# Patient Record
Sex: Female | Born: 1935 | Race: White | Hispanic: No | Smoking: Never smoker
Health system: Southern US, Community
[De-identification: ages and names within clinical notes are randomized; demographics above are authoritative.]

## PROBLEM LIST (undated history)

## (undated) DIAGNOSIS — R011 Cardiac murmur, unspecified: Secondary | ICD-10-CM

## (undated) DIAGNOSIS — I350 Nonrheumatic aortic (valve) stenosis: Secondary | ICD-10-CM

## (undated) HISTORY — DX: Nonrheumatic aortic (valve) stenosis: I35.0

## (undated) HISTORY — DX: Cardiac murmur, unspecified: R01.1

---

## 2002-03-18 ENCOUNTER — Other Ambulatory Visit: Admission: RE | Admit: 2002-03-18 | Discharge: 2002-03-18 | Payer: Self-pay | Admitting: Family Medicine

## 2002-07-02 ENCOUNTER — Ambulatory Visit (HOSPITAL_COMMUNITY): Admission: RE | Admit: 2002-07-02 | Discharge: 2002-07-02 | Payer: Self-pay | Admitting: Gastroenterology

## 2004-08-09 ENCOUNTER — Other Ambulatory Visit: Admission: RE | Admit: 2004-08-09 | Discharge: 2004-08-09 | Payer: Self-pay | Admitting: Family Medicine

## 2005-08-31 ENCOUNTER — Other Ambulatory Visit: Admission: RE | Admit: 2005-08-31 | Discharge: 2005-08-31 | Payer: Self-pay | Admitting: Family Medicine

## 2010-03-07 ENCOUNTER — Encounter: Payer: Self-pay | Admitting: Family Medicine

## 2010-06-15 ENCOUNTER — Emergency Department (HOSPITAL_COMMUNITY)
Admission: EM | Admit: 2010-06-15 | Discharge: 2010-06-15 | Disposition: A | Discharge status: Home / Self Care | Payer: Medicare Other | Attending: Emergency Medicine | Admitting: Emergency Medicine

## 2010-06-15 DIAGNOSIS — D500 Iron deficiency anemia secondary to blood loss (chronic): Secondary | ICD-10-CM | POA: Insufficient documentation

## 2010-06-15 DIAGNOSIS — R040 Epistaxis: Secondary | ICD-10-CM | POA: Insufficient documentation

## 2010-06-15 LAB — CBC
MCH: 27.6 pg (ref 26.0–34.0)
MCHC: 31.8 g/dL (ref 30.0–36.0)
Platelets: 162 10*3/uL (ref 150–400)
RBC: 3.55 MIL/uL — ABNORMAL LOW (ref 3.87–5.11)
WBC: 8 10*3/uL (ref 4.0–10.5)

## 2013-04-29 ENCOUNTER — Other Ambulatory Visit: Payer: Self-pay | Admitting: Nurse Practitioner

## 2013-04-29 DIAGNOSIS — Z1231 Encounter for screening mammogram for malignant neoplasm of breast: Secondary | ICD-10-CM

## 2016-04-06 ENCOUNTER — Ambulatory Visit (INDEPENDENT_AMBULATORY_CARE_PROVIDER_SITE_OTHER): Payer: Self-pay

## 2016-04-06 ENCOUNTER — Encounter (INDEPENDENT_AMBULATORY_CARE_PROVIDER_SITE_OTHER): Payer: Self-pay | Admitting: Orthopedic Surgery

## 2016-04-06 ENCOUNTER — Ambulatory Visit (INDEPENDENT_AMBULATORY_CARE_PROVIDER_SITE_OTHER): Payer: Medicare HMO | Admitting: Orthopedic Surgery

## 2016-04-06 ENCOUNTER — Ambulatory Visit (INDEPENDENT_AMBULATORY_CARE_PROVIDER_SITE_OTHER): Payer: Medicare HMO

## 2016-04-06 VITALS — Ht 59.0 in | Wt 95.0 lb

## 2016-04-06 DIAGNOSIS — M79672 Pain in left foot: Secondary | ICD-10-CM

## 2016-04-06 DIAGNOSIS — M79671 Pain in right foot: Secondary | ICD-10-CM | POA: Diagnosis not present

## 2016-04-06 DIAGNOSIS — G8929 Other chronic pain: Secondary | ICD-10-CM

## 2016-04-06 NOTE — Progress Notes (Signed)
   Office Visit Note   Patient: Jean Gibson           Date of Birth: [birth year]           MRN: [id number] Visit Date: 04/06/2016              Requested by: No referring provider defined for this encounter. PCP: No primary care provider on file.   Assessment & Plan: Visit Diagnoses:  1. Pain in right foot   2. Heel pain, chronic, left     Plan: Continue with regular shoewear no restrictions for the proximal phalanx fracture at the PIP joint fourth toe right foot. Patient's calcaneal fracture symptoms have resolved she may continue his regular activities without restrictions.  Follow-Up Instructions: Return if symptoms worsen or fail to improve.   Orders:  Orders Placed This Encounter  Procedures  . XR Foot Complete Right  . XR Foot 2 Views Left   No orders of the defined types were placed in this encounter.     Procedures: No procedures performed   Clinical Data: No additional findings.   Subjective: Chief Complaint  Patient presents with  . Left Ankle - Pain    Hx of left calcaneus fx 2017    Patient states that she was at the beach last week and could hardly walk. She had swelling in the ankle and pain in the heel on the left. She also thinks that she broke her right 4th toe. She is having swelling and some pain in it. She states that she thinks she broke it while walking on rocks and shells.     Review of Systems   Objective: Vital Signs: Ht 4\' 11"  (1.499 m)   Wt 95 lb (43.1 kg)   BMI 19.19 kg/m   Physical Exam examination patient is alert oriented no adenopathy well-dressed normal affect normal respiratory effort she has a normal gait. She has good pulses bilaterally good range of motion the ankle and subtalar joint bilaterally she has swelling of the fourth toe but no angular deformity right foot. Calcaneus is nontender with lateral compression.  Ortho Exam  Specialty Comments:  No specialty comments available.  Imaging: Xr Foot 2 Views  Left  Result Date: 04/06/2016 Two-view radiographs of the left calcaneus shows a congruent subtalar joint and calcaneal fractures well healed no complicating features.  Xr Foot Complete Right  Result Date: 04/06/2016 Three-view radiographs the right foot shows a fracture of the proximal phalanx of the PIP joint fourth toe right foot. No evidence of any stress fractures.    PMFS History: There are no active problems to display for this patient.  No past medical history on file.  No family history on file.  No past surgical history on file. Social History   Occupational History  . Not on file.   Social History Main Topics  . Smoking status: Never Smoker  . Smokeless tobacco: Never Used  . Alcohol use Not on file  . Drug use: Unknown  . Sexual activity: Not on file

## 2021-03-09 ENCOUNTER — Ambulatory Visit (INDEPENDENT_AMBULATORY_CARE_PROVIDER_SITE_OTHER): Payer: Medicare Other | Admitting: Family

## 2021-03-09 ENCOUNTER — Ambulatory Visit (INDEPENDENT_AMBULATORY_CARE_PROVIDER_SITE_OTHER): Payer: Medicare Other

## 2021-03-09 ENCOUNTER — Telehealth: Payer: Self-pay | Admitting: Family

## 2021-03-09 ENCOUNTER — Other Ambulatory Visit: Payer: Self-pay

## 2021-03-09 DIAGNOSIS — M79671 Pain in right foot: Secondary | ICD-10-CM | POA: Diagnosis not present

## 2021-03-09 NOTE — Telephone Encounter (Signed)
Patient's daughter Aurea Graff called advised patient would like to have the hard shoe  for her right foot and asked if she can come get it? The number to contact Aurea Graff is [phone] or the number to [facility] is [phone]

## 2021-03-10 NOTE — Telephone Encounter (Signed)
Ok per [employer] for post op shoe. Can you please call and advise ok to pick up and sign I pad? Thanks!

## 2021-03-10 NOTE — Progress Notes (Signed)
° °  Office Visit Note   Patient: Jean Gibson           Date of Birth: Jan 16, 1928           MRN: [id number] Visit Date: 03/09/2021              Requested by: No referring provider defined for this encounter. PCP: No primary care provider on file.  Chief Complaint  Patient presents with   Right Foot - Pain    DOI 03/08/2021 tripped and fell on right foot last night       HPI: Patient is a 88 year old woman who was ambulating in her home when she tripped and fell last night.  She has been having pain over the dorsum of her foot and along the lateral column since this is associated with some mild swelling and bruising.  Today she is walking in some stiff boots which are providing comfort  Assessment & Plan: Visit Diagnoses:  1. Pain in right foot     Plan: We will treat her for right foot fracture.  Offered a postop shoe at this time the patient declined she is fears that the shoe will make her at risk for falling discussed using stiff walking shoes she will follow-up in the office in 2 weeks she may weight-bear as tolerated in a stiff shoe  Patient and family are in agreement with the plan  Follow-Up Instructions: No follow-ups on file.   Ortho Exam  Patient is alert, oriented, no adenopathy, well-dressed, normal affect, normal respiratory effort.  On examination of the right lower extremity she has minimal edema mild ecchymosis over the dorsum and lateral column she is tender to palpation along the base of the fifth metatarsal. Palpable dorsalis pedis pulse.  She is neurovascularly intact distally.  Imaging: No results found. No images are attached to the encounter.  Labs: No results found for: HGBA1C, ESRSEDRATE, CRP, LABURIC, REPTSTATUS, GRAMSTAIN, CULT, LABORGA   No results found for: ALBUMIN, PREALBUMIN, CBC  No results found for: MG No results found for: VD25OH  No results found for: PREALBUMIN CBC EXTENDED Latest Ref Rng & Units 06/15/2010  WBC 4.0 - 10.5 K/uL  8.0  RBC 3.87 - 5.11 MIL/uL 3.55(L)  HGB 12.0 - 15.0 g/dL 9.8(L)  HCT 36.0 - 46.0 % 30.8(L)  PLT 150 - 400 K/uL 162     There is no height or weight on file to calculate BMI.  Orders:  Orders Placed This Encounter  Procedures   XR Foot 2 Views Right   No orders of the defined types were placed in this encounter.    Procedures: No procedures performed  Clinical Data: No additional findings.  ROS:  All other systems negative, except as noted in the HPI. Review of Systems  Constitutional: Negative.    Objective: Vital Signs: There were no vitals taken for this visit.  Specialty Comments:  No specialty comments available.  PMFS History: There are no problems to display for this patient.  History reviewed. No pertinent past medical history.  History reviewed. No pertinent family history.  History reviewed. No pertinent surgical history. Social History   Occupational History   Not on file  Tobacco Use   Smoking status: Never   Smokeless tobacco: Never  Substance and Sexual Activity   Alcohol use: Not on file   Drug use: Not on file   Sexual activity: Not on file

## 2021-03-10 NOTE — Telephone Encounter (Signed)
I spoke with pt's dtr, informed her okay for post op shoe and she said she already bought a shoe for her mom last night.

## 2021-03-19 ENCOUNTER — Encounter: Payer: Self-pay | Admitting: Family

## 2021-04-01 ENCOUNTER — Ambulatory Visit: Payer: Self-pay

## 2021-04-01 ENCOUNTER — Other Ambulatory Visit: Payer: Self-pay

## 2021-04-01 ENCOUNTER — Ambulatory Visit: Payer: Medicare Other | Admitting: Orthopedic Surgery

## 2021-04-01 DIAGNOSIS — S99191D Other physeal fracture of right metatarsal, subsequent encounter for fracture with routine healing: Secondary | ICD-10-CM

## 2021-04-01 DIAGNOSIS — M79671 Pain in right foot: Secondary | ICD-10-CM

## 2021-04-13 ENCOUNTER — Encounter: Payer: Self-pay | Admitting: Orthopedic Surgery

## 2021-04-13 NOTE — Progress Notes (Signed)
° °  Office Visit Note   Patient: Jean Gibson           Date of Birth: October 29, 1927           MRN: [id number] Visit Date: 04/01/2021              Requested by: No referring provider defined for this encounter. PCP: No primary care provider on file.  Chief Complaint  Patient presents with   Right Foot - Follow-up    DOI 03/08/21      HPI: Patient is a 88 year old woman who is seen in follow-up for nondisplaced fracture through the metaphyseal bone base of the fifth metatarsal right foot.  Patient states she feels well has a postoperative shoe is not wearing a fracture boot at this time.  Assessment & Plan: Visit Diagnoses:  1. Pain in right foot   2. Closed fracture of base of fifth metatarsal bone of right foot at metaphyseal-diaphyseal junction with routine healing, subsequent encounter     Plan: Patient's fracture is healed she will advance to regular shoewear.  Follow-Up Instructions: Return if symptoms worsen or fail to improve.   Ortho Exam  Patient is alert, oriented, no adenopathy, well-dressed, normal affect, normal respiratory effort. Examination the fifth metatarsal is nontender to palpation there is no ecchymosis or bruising.  Distraction across the hindfoot is not painful.  Imaging: No results found. No images are attached to the encounter.  Labs: No results found for: HGBA1C, ESRSEDRATE, CRP, LABURIC, REPTSTATUS, GRAMSTAIN, CULT, LABORGA   No results found for: ALBUMIN, PREALBUMIN, CBC  No results found for: MG No results found for: VD25OH  No results found for: PREALBUMIN CBC EXTENDED Latest Ref Rng & Units 06/15/2010  WBC 4.0 - 10.5 K/uL 8.0  RBC 3.87 - 5.11 MIL/uL 3.55(L)  HGB 12.0 - 15.0 g/dL 1.6(L)  HCT 84.5 - 36.4 % 30.8(L)  PLT 150 - 400 K/uL 162     There is no height or weight on file to calculate BMI.  Orders:  Orders Placed This Encounter  Procedures   XR Foot Complete Right   No orders of the defined types were placed in this  encounter.    Procedures: No procedures performed  Clinical Data: No additional findings.  ROS:  All other systems negative, except as noted in the HPI. Review of Systems  Objective: Vital Signs: There were no vitals taken for this visit.  Specialty Comments:  No specialty comments available.  PMFS History: There are no problems to display for this patient.  History reviewed. No pertinent past medical history.  History reviewed. No pertinent family history.  History reviewed. No pertinent surgical history. Social History   Occupational History   Not on file  Tobacco Use   Smoking status: Never   Smokeless tobacco: Never  Substance and Sexual Activity   Alcohol use: Not on file   Drug use: Not on file   Sexual activity: Not on file

## 2021-06-15 ENCOUNTER — Emergency Department (HOSPITAL_COMMUNITY)
Admission: EM | Admit: 2021-06-15 | Discharge: 2021-06-15 | Disposition: A | Discharge status: Home / Self Care | Payer: Medicare Other | Attending: Emergency Medicine | Admitting: Emergency Medicine

## 2021-06-15 ENCOUNTER — Emergency Department (HOSPITAL_COMMUNITY): Payer: Medicare Other

## 2021-06-15 ENCOUNTER — Other Ambulatory Visit: Payer: Self-pay

## 2021-06-15 ENCOUNTER — Encounter (HOSPITAL_COMMUNITY): Payer: Self-pay

## 2021-06-15 DIAGNOSIS — R410 Disorientation, unspecified: Secondary | ICD-10-CM

## 2021-06-15 DIAGNOSIS — Z7982 Long term (current) use of aspirin: Secondary | ICD-10-CM | POA: Diagnosis not present

## 2021-06-15 LAB — CBC
HCT: 36.8 % (ref 36.0–46.0)
Hemoglobin: 11.8 g/dL — ABNORMAL LOW (ref 12.0–15.0)
MCH: 29.8 pg (ref 26.0–34.0)
MCHC: 32.1 g/dL (ref 30.0–36.0)
MCV: 92.9 fL (ref 80.0–100.0)
Platelets: 188 10*3/uL (ref 150–400)
RBC: 3.96 MIL/uL (ref 3.87–5.11)
RDW: 13.2 % (ref 11.5–15.5)
WBC: 10.1 10*3/uL (ref 4.0–10.5)
nRBC: 0 % (ref 0.0–0.2)

## 2021-06-15 LAB — COMPREHENSIVE METABOLIC PANEL
ALT: 17 U/L (ref 0–44)
AST: 25 U/L (ref 15–41)
Albumin: 3.9 g/dL (ref 3.5–5.0)
Alkaline Phosphatase: 62 U/L (ref 38–126)
Anion gap: 8 (ref 5–15)
BUN: 24 mg/dL — ABNORMAL HIGH (ref 8–23)
CO2: 25 mmol/L (ref 22–32)
Calcium: 9 mg/dL (ref 8.9–10.3)
Chloride: 102 mmol/L (ref 98–111)
Creatinine, Ser: 0.7 mg/dL (ref 0.44–1.00)
GFR, Estimated: >60 mL/min (ref >60)
Glucose, Bld: 129 mg/dL — ABNORMAL HIGH (ref 70–99)
Potassium: 4.1 mmol/L (ref 3.5–5.1)
Sodium: 135 mmol/L (ref 135–145)
Total Bilirubin: 0.5 mg/dL (ref 0.3–1.2)
Total Protein: 7.2 g/dL (ref 6.5–8.1)

## 2021-06-15 LAB — URINALYSIS, ROUTINE W REFLEX MICROSCOPIC
Bilirubin Urine: NEGATIVE
Glucose, UA: NEGATIVE mg/dL
Hgb urine dipstick: NEGATIVE
Ketones, ur: NEGATIVE mg/dL
Leukocytes,Ua: NEGATIVE
Nitrite: NEGATIVE
Protein, ur: NEGATIVE mg/dL
Specific Gravity, Urine: 1.009 (ref 1.005–1.030)
pH: 7 (ref 5.0–8.0)

## 2021-06-15 NOTE — Discharge Instructions (Signed)
Your CT scan of the brain and your blood work and urine sample thankfully did not show any signs of any serious injury or emergency.  I recommend you follow-up with your primary care doctor. ?
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
## 2021-06-15 NOTE — ED Notes (Signed)
I provided reinforced discharge education based off of discharge instructions. Pt acknowledged and understood my education. Pt had no further questions/concerns for provider/myself.  °
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
## 2023-04-16 IMAGING — CT CT HEAD W/O CM
3 series · 14 of 46 positions shown, 16 images · non-contrast
Comparison: None Available.

CLINICAL DATA: Confusion



[Series 2: head wo · axial · 0.47mm/px · z∈[-126,-6]mm · 8 of 29 slices shown, 10 images]
[im 3/29  brain]
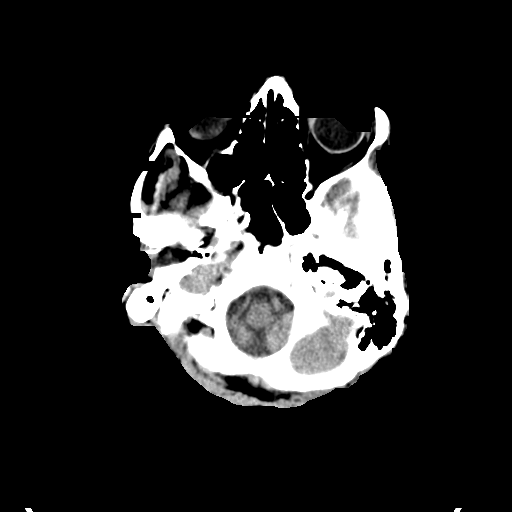
[im 3/29  bone]
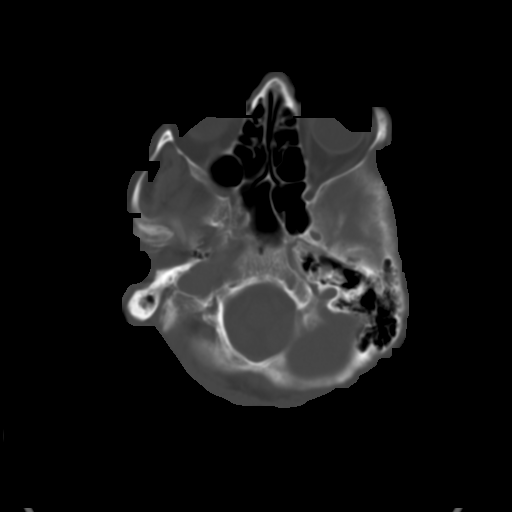
[im 7/29  brain]
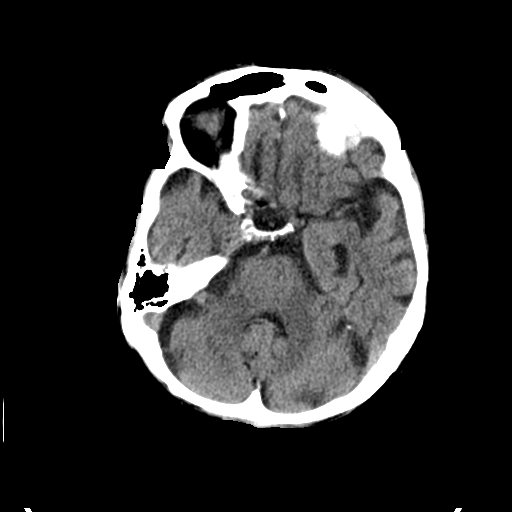
[im 10/29  brain]
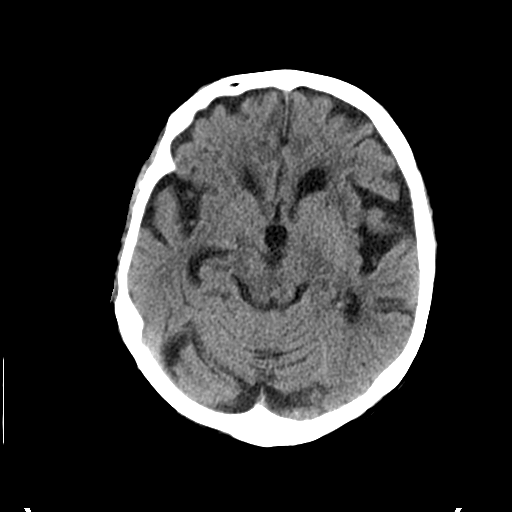
[im 13/29  brain]
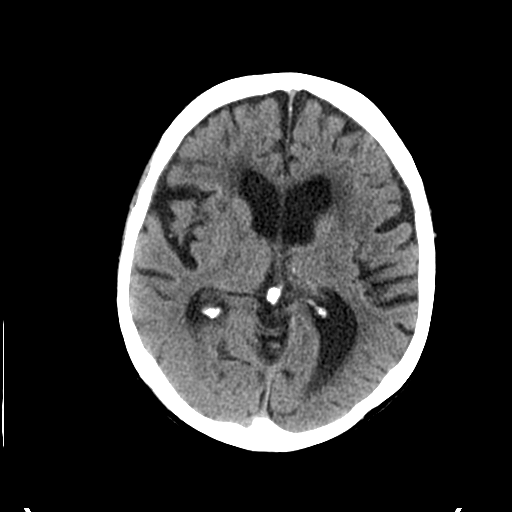
[im 17/29  brain]
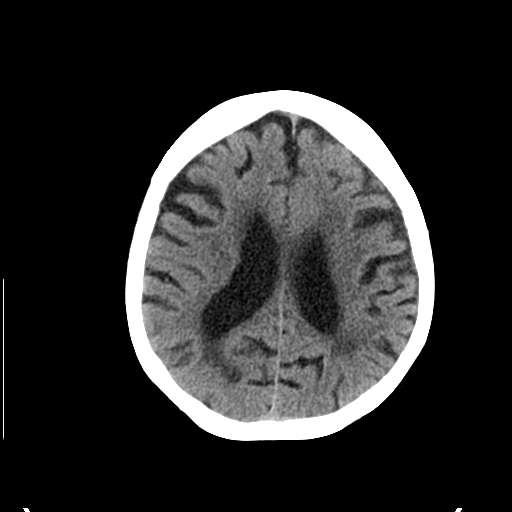
[im 17/29  bone]
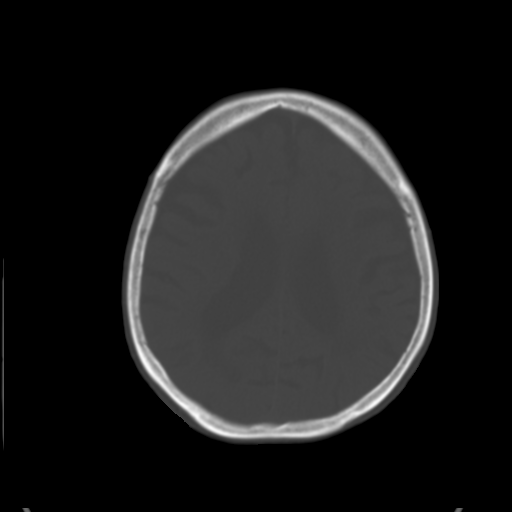
[im 20/29  brain]
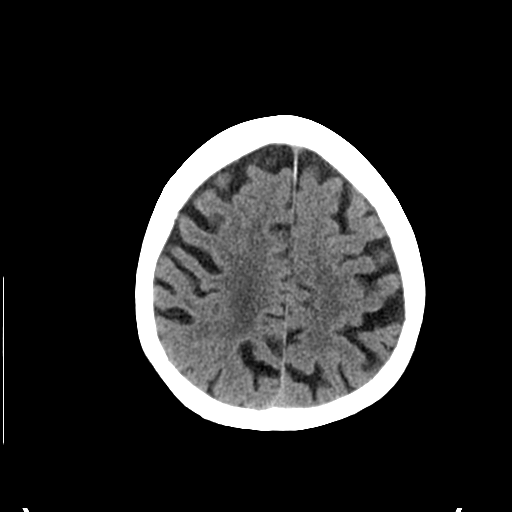
[im 23/29  brain]
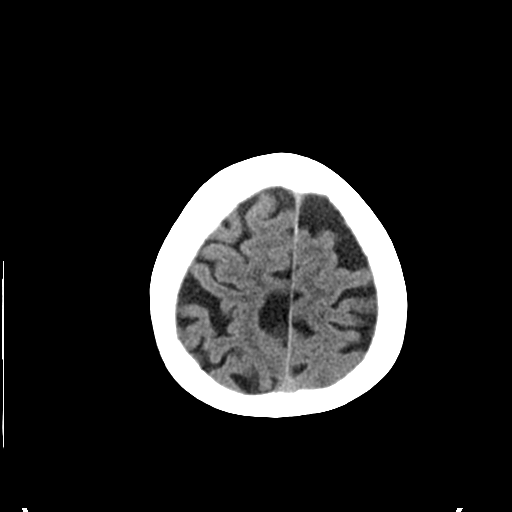
[im 27/29  brain]
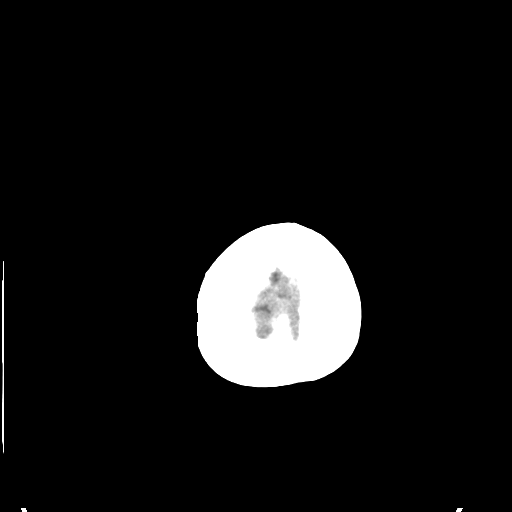

[Series 4: coronal soft tissue · coronal · 0.30mm/px · 3 of 61 slices shown]
[im 21/61  brain]
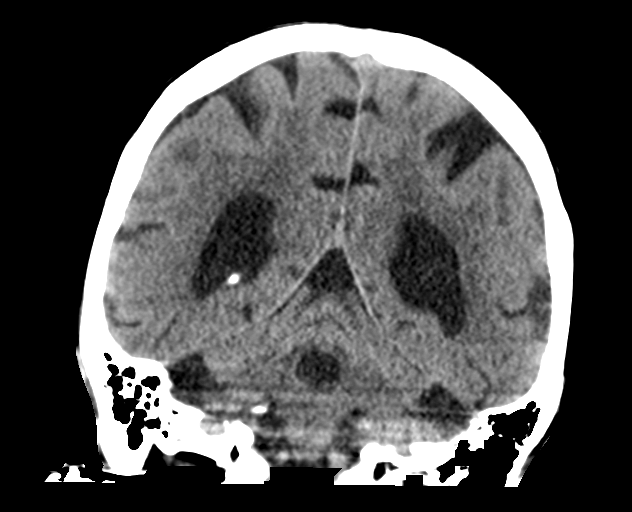
[im 27/61  brain]
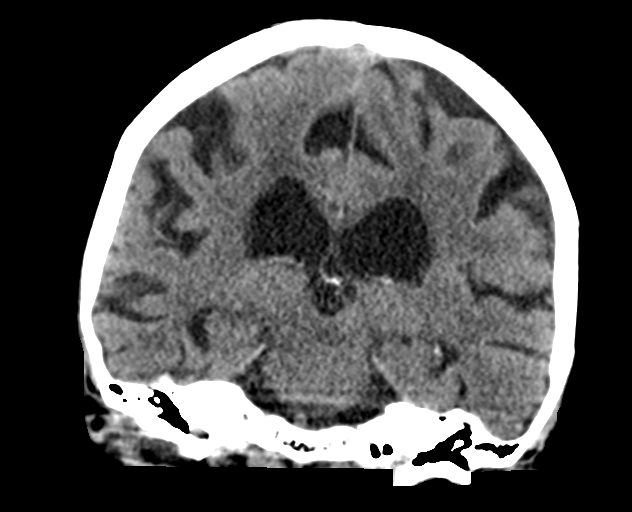
[im 34/61  brain]
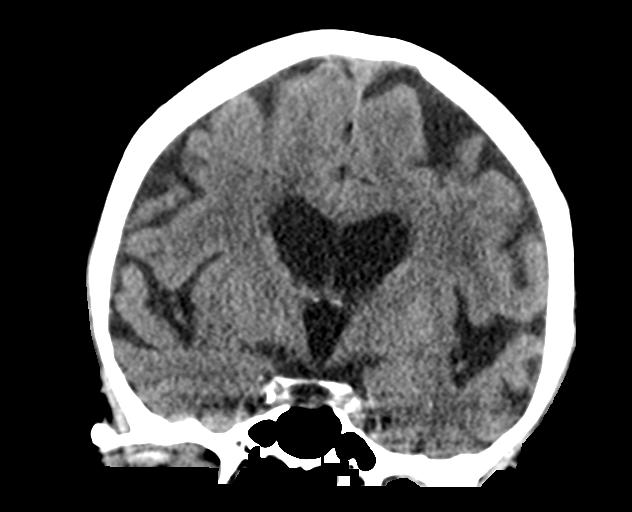

[Series 5: sagittal soft tissue · sagittal · 0.32mm/px · 3 of 52 slices shown]
[im 18/52  brain]
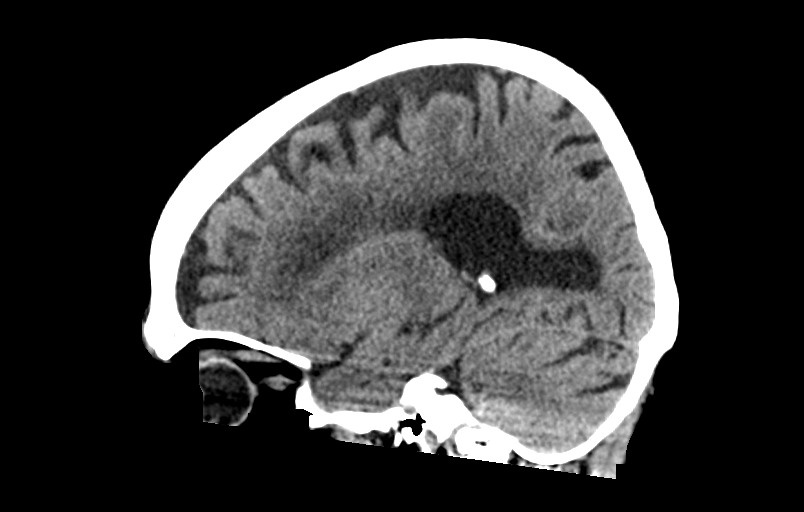
[im 26/52  brain]
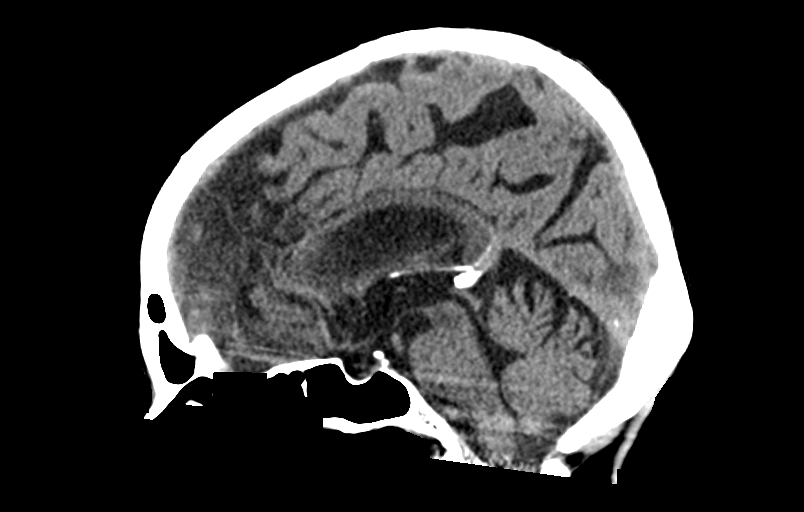
[im 35/52  brain]
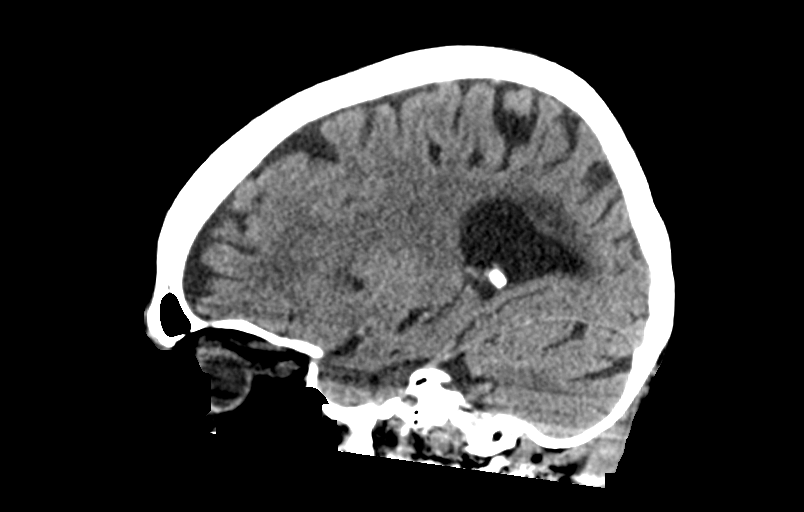

[14 of 46 positions shown; findings below may reference images not displayed]

FINDINGS: Brain: No acute territorial infarction, hemorrhage or intracranial
mass. Atrophy and chronic small vessel ischemic changes of the white
matter. Chronic lacunar infarct in the right basal ganglia.
Ventricles are nonenlarged

Vascular: No hyperdense vessels.  Carotid vascular calcification

Skull: Normal. Negative for fracture or focal lesion.

Sinuses/Orbits: No acute finding.

Other: None
IMPRESSION: 1. No CT evidence for acute intracranial abnormality.
2. Atrophy and chronic small vessel ischemic changes of the white
matter

## 2023-12-01 ENCOUNTER — Encounter (HOSPITAL_BASED_OUTPATIENT_CLINIC_OR_DEPARTMENT_OTHER): Payer: Self-pay

## 2023-12-01 ENCOUNTER — Emergency Department (HOSPITAL_BASED_OUTPATIENT_CLINIC_OR_DEPARTMENT_OTHER)
Admission: EM | Admit: 2023-12-01 | Discharge: 2023-12-01 | Disposition: A | Discharge status: Home / Self Care | Attending: Emergency Medicine | Admitting: Emergency Medicine

## 2023-12-01 ENCOUNTER — Other Ambulatory Visit: Payer: Self-pay

## 2023-12-01 ENCOUNTER — Emergency Department (HOSPITAL_BASED_OUTPATIENT_CLINIC_OR_DEPARTMENT_OTHER)

## 2023-12-01 DIAGNOSIS — S01312A Laceration without foreign body of left ear, initial encounter: Secondary | ICD-10-CM | POA: Insufficient documentation

## 2023-12-01 DIAGNOSIS — S0101XA Laceration without foreign body of scalp, initial encounter: Secondary | ICD-10-CM | POA: Insufficient documentation

## 2023-12-01 DIAGNOSIS — W01198A Fall on same level from slipping, tripping and stumbling with subsequent striking against other object, initial encounter: Secondary | ICD-10-CM | POA: Insufficient documentation

## 2023-12-01 DIAGNOSIS — Z7982 Long term (current) use of aspirin: Secondary | ICD-10-CM | POA: Insufficient documentation

## 2023-12-01 DIAGNOSIS — R42 Dizziness and giddiness: Secondary | ICD-10-CM | POA: Insufficient documentation

## 2023-12-01 DIAGNOSIS — S0990XA Unspecified injury of head, initial encounter: Secondary | ICD-10-CM | POA: Diagnosis present

## 2023-12-01 DIAGNOSIS — W19XXXA Unspecified fall, initial encounter: Secondary | ICD-10-CM

## 2023-12-01 LAB — COMPREHENSIVE METABOLIC PANEL WITH GFR
ALT: 53 U/L — ABNORMAL HIGH (ref 0–44)
AST: 36 U/L (ref 15–41)
Albumin: 4.2 g/dL (ref 3.5–5.0)
Alkaline Phosphatase: 132 U/L — ABNORMAL HIGH (ref 38–126)
Anion gap: 10 (ref 5–15)
BUN: 30 mg/dL — ABNORMAL HIGH (ref 8–23)
CO2: 27 mmol/L (ref 22–32)
Calcium: 9.5 mg/dL (ref 8.9–10.3)
Chloride: 102 mmol/L (ref 98–111)
Creatinine, Ser: 0.64 mg/dL (ref 0.44–1.00)
GFR, Estimated: >60 mL/min (ref >60)
Glucose, Bld: 149 mg/dL — ABNORMAL HIGH (ref 70–99)
Potassium: 4 mmol/L (ref 3.5–5.1)
Sodium: 139 mmol/L (ref 135–145)
Total Bilirubin: 0.4 mg/dL (ref 0.0–1.2)
Total Protein: 7.6 g/dL (ref 6.5–8.1)

## 2023-12-01 LAB — CBC
HCT: 34.2 % — ABNORMAL LOW (ref 36.0–46.0)
Hemoglobin: 10.6 g/dL — ABNORMAL LOW (ref 12.0–15.0)
MCH: 28.7 pg (ref 26.0–34.0)
MCHC: 31 g/dL (ref 30.0–36.0)
MCV: 92.7 fL (ref 80.0–100.0)
Platelets: 216 K/uL (ref 150–400)
RBC: 3.69 MIL/uL — ABNORMAL LOW (ref 3.87–5.11)
RDW: 15.6 % — ABNORMAL HIGH (ref 11.5–15.5)
WBC: 6.1 K/uL (ref 4.0–10.5)
nRBC: 0 % (ref 0.0–0.2)

## 2023-12-01 LAB — URINALYSIS, ROUTINE W REFLEX MICROSCOPIC
Bacteria, UA: NONE SEEN
Bilirubin Urine: NEGATIVE
Glucose, UA: NEGATIVE mg/dL
Ketones, ur: NEGATIVE mg/dL
Leukocytes,Ua: NEGATIVE
Nitrite: NEGATIVE
Specific Gravity, Urine: 1.026 (ref 1.005–1.030)
pH: 7 (ref 5.0–8.0)

## 2023-12-01 LAB — CBG MONITORING, ED: Glucose-Capillary: 142 mg/dL — ABNORMAL HIGH (ref 70–99)

## 2023-12-01 MED ORDER — TETANUS-DIPHTH-ACELL PERTUSSIS 5-2-15.5 LF-MCG/0.5 IM SUSP
0.5000 mL | Freq: Once | INTRAMUSCULAR | Status: AC
  Administered 2023-12-01: 0.5 mL via INTRAMUSCULAR
  Filled 2023-12-01: qty 0.5

## 2023-12-01 MED ADMIN — Bacitracin Zinc Oint 500 Unit/GM: 31.5 | TOPICAL | NDC 58980001110

## 2023-12-01 MED FILL — Bacitracin Zinc Oint 500 Unit/GM: CUTANEOUS | Qty: 28.35 | Status: AC

## 2023-12-01 NOTE — ED Provider Notes (Signed)
 [place] EMERGENCY DEPARTMENT AT [facility] Provider Note   CSN: [id number] Arrival date & time: 12/01/23  9165     Patient presents with: Loss of Consciousness and Head Injury   Jean Gibson is a 89 y.o. female.  {Add pertinent medical, surgical, social history, OB history to HPI:32947}  Loss of Consciousness Head Injury     History reviewed. No pertinent past medical history.  Prior to Admission medications   Medication Sig Start Date End Date Taking? Authorizing Provider  aspirin 81 MG chewable tablet Chew by mouth daily.    [provider]  Calcium Carbonate-Vitamin D 600-400 MG-UNIT tablet Take by mouth. 04/24/13   [provider]  Multiple Vitamins-Minerals (CENTRUM SILVER PO) Take by mouth.    [provider]    Allergies: Patient has no known allergies.    Review of Systems  Cardiovascular:  Positive for syncope.    Updated Vital Signs BP (!) 152/66   Pulse 86   Temp 98.7 F (37.1 C) (Oral)   Resp (!) 29   SpO2 95%   Physical Exam  (all labs ordered are listed, but only abnormal results are displayed) Labs Reviewed  COMPREHENSIVE METABOLIC PANEL WITH GFR - Abnormal; Notable for the following components:      Result Value   Glucose, Bld 149 (*)    BUN 30 (*)    ALT 53 (*)    Alkaline Phosphatase 132 (*)    All other components within normal limits  CBC - Abnormal; Notable for the following components:   RBC 3.69 (*)    Hemoglobin 10.6 (*)    HCT 34.2 (*)    RDW 15.6 (*)    All other components within normal limits  URINALYSIS, ROUTINE W REFLEX MICROSCOPIC - Abnormal; Notable for the following components:   Hgb urine dipstick TRACE (*)    Protein, ur TRACE (*)    All other components within normal limits  CBG MONITORING, ED - Abnormal; Notable for the following components:   Glucose-Capillary 142 (*)    All other components within normal limits    EKG: EKG Interpretation Date/Time:  Friday December 01 2023 08:49:23 EDT Ventricular Rate:  96 PR Interval:  206 QRS Duration:  92 QT Interval:  378 QTC Calculation: 477 R Axis:   -33  Text Interpretation: Normal sinus rhythm Left axis deviation Moderate voltage criteria for LVH, may be normal variant ( R in aVL , Cornell product ) Anterior infarct , age undetermined Abnormal ECG No previous ECGs available Confirmed by Patsey Lot [id number]) on 12/01/2023 9:30:17 AM  Radiology: CT Cervical Spine Wo Contrast Result Date: 12/01/2023 EXAM: CT CERVICAL SPINE WITHOUT CONTRAST 12/01/2023 09:23:39 AM TECHNIQUE: CT of the cervical spine was performed without the administration of intravenous contrast. Multiplanar reformatted images are provided for review. Automated exposure control, iterative reconstruction, and/or weight based adjustment of the mA/kV was utilized to reduce the radiation dose to as low as reasonably achievable. COMPARISON: Head CT 12/01/2023 reported separately. CLINICAL HISTORY: 89 year old female. Blunt facial trauma. FINDINGS: CERVICAL SPINE: BONES AND ALIGNMENT: Mild straightening of lordosis. C1 and C2 appear intact and aligned. Maintained vertebral height. No acute fracture or traumatic malalignment. DEGENERATIVE CHANGES: Chronic severe C1-C2 degeneration craniocervical junction. Advanced chronic disc and endplate degeneration throughout the cervical and visible upper thoracic spine. No significant cervical spinal stenosis by CT. SOFT TISSUES: No prevertebral soft tissue swelling. Calcified cervical carotid atherosclerosis. LUNGS (visible portions): Symmetric apical lung scarring. IMPRESSION: 1. No acute  traumatic injury identified in the cervical spine. 2. Chronic degeneration. Electronically signed by: Helayne Hurst MD 12/01/2023 09:43 AM EDT RP Workstation: [id number]   CT Head Wo Contrast Result Date: 12/01/2023 EXAM: CT HEAD WITHOUT CONTRAST 12/01/2023 09:23:39 AM TECHNIQUE: CT of the head was performed without the administration  of intravenous contrast. Automated exposure control, iterative reconstruction, and/or weight based adjustment of the mA/kV was utilized to reduce the radiation dose to as low as reasonably achievable. COMPARISON: Head CT 06/15/2021. CLINICAL HISTORY: 89 year old female. Head trauma, GCS=15, no focal neuro findings (low risk). FINDINGS: BRAIN AND VENTRICLES: No acute hemorrhage. No evidence of acute infarct. No hydrocephalus. No extra-axial collection. No mass effect or midline shift. Stable brain volume since 2023. 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 No suspicious intracranial vascular hyperdensity. ORBITS: No acute abnormality. SINUSES: No acute abnormality. SOFT TISSUES AND SKULL: Left lateral scalp superficial laceration with overlying dressing material in place. No discrete scalp hematoma. Calvarium appears stable and intact. IMPRESSION: 1. Left lateral scalp soft tissue injury. 2. No acute intracranial abnormality. Stable mild for age chronic small vessel disease. Electronically signed by: Helayne Hurst MD 12/01/2023 09:40 AM EDT RP Workstation: [id number]    {Document cardiac monitor, telemetry assessment procedure when appropriate:32947} Procedures   Medications Ordered in the ED  bacitracin ointment (has no administration in time range)      {Click here for ABCD2, HEART and other calculators REFRESH Note before signing:1}                              Medical Decision Making Amount and/or Complexity of Data Reviewed Labs: ordered. Radiology: ordered.  Risk OTC drugs.   ***  {Document critical care time when appropriate  Document review of labs and clinical decision tools ie CHADS2VASC2, etc  Document your independent review of radiology images and any outside records  Document your discussion with family members, caretakers and with consultants  Document  social determinants of health affecting pt's care  Document your decision making why or why not admission, treatments were needed:32947:::1}   Final diagnoses:  None    ED Discharge Orders     None

## 2023-12-01 NOTE — ED Triage Notes (Addendum)
 Patient reports washing up at the sink, feeling dizzy and falling landing on the trash can, cracking it in half and also reports that there are some dumbbells near the trash can too she may have hit. Patient is not anticoagulated. She reports yesterday she had blood taken and a flu shot from her PCP. Has bleeding from left side of head, controlled with gauze.

## 2023-12-01 NOTE — Discharge Instructions (Signed)
 This staples can come out in a week to 10 days.  Antibacterial ointment can be used on the ear.  Dressings can also be used.  Follow-up with ENT.

## 2023-12-01 NOTE — ED Notes (Signed)
 Health teaching done about dressing of wound /suture.

## 2023-12-12 ENCOUNTER — Ambulatory Visit: Attending: Cardiology | Admitting: Cardiology

## 2023-12-12 ENCOUNTER — Ambulatory Visit: Attending: Cardiology

## 2023-12-12 ENCOUNTER — Encounter: Payer: Self-pay | Admitting: Cardiology

## 2023-12-12 VITALS — BP 134/82 | HR 74 | Ht <= 58 in | Wt 86.8 lb

## 2023-12-12 DIAGNOSIS — R011 Cardiac murmur, unspecified: Secondary | ICD-10-CM | POA: Diagnosis not present

## 2023-12-12 DIAGNOSIS — R55 Syncope and collapse: Secondary | ICD-10-CM

## 2023-12-12 NOTE — Patient Instructions (Addendum)
 Medication Instructions:  Your physician recommends that you continue on your current medications as directed. Please refer to the Current Medication list given to you today.    *If you need a refill on your cardiac medications before your next appointment, please call your pharmacy*   Lab Work: None   If you have labs (blood work) drawn today and your tests are completely normal, you will receive your results only by: MyChart Message (if you have MyChart) OR A paper copy in the mail If you have any lab test that is abnormal or we need to change your treatment, we will call you to review the results.    Testing/Procedures: ASAP Echocardiogram Your physician has requested that you have an echocardiogram. Echocardiography is a painless test that uses sound waves to create images of your heart. It provides your doctor with information about the size and shape of your heart and how well your heart's chambers and valves are working. This procedure takes approximately one hour. There are no restrictions for this procedure. Please do NOT wear cologne, perfume, aftershave, or lotions (deodorant is allowed). Please arrive 15 minutes prior to your appointment time.  Please note: We ask at that you not bring children with you during ultrasound (echo/ vascular) testing. Due to room size and safety concerns, children are not allowed in the ultrasound rooms during exams. Our front office staff cannot provide observation of children in our lobby area while testing is being conducted. An adult accompanying a patient to their appointment will only be allowed in the ultrasound room at the discretion of the ultrasound technician under special circumstances. We apologize for any inconvenience.   Your physician has recommended that you wear an event monitor. Event monitors are medical devices that record the heart's electrical activity. Doctors most often us  these monitors to diagnose arrhythmias. Arrhythmias  are problems with the speed or rhythm of the heartbeat. The monitor is a small, portable device. You can wear one while you do your normal daily activities. This is usually used to diagnose what is causing palpitations/syncope (passing out).  `  ZIO AT Long term monitor-Live Telemetry  Your physician has requested you wear a ZIO patch monitor for 14 days.  This is a single patch monitor. [employer] supplies one patch monitor per enrollment. Additional  stickers are not available.  Please do not apply patch if you will be having a Nuclear Stress Test, Echocardiogram, Cardiac CT, MRI,  or Chest Xray during the period you would be wearing the monitor. The patch cannot be worn during  these tests. You cannot remove and re-apply the ZIO AT patch monitor.  Your ZIO patch monitor will be mailed 3 day USPS to your address on file. It may take 3-5 days to  receive your monitor after you have been enrolled.  Once you have received your monitor, please review the enclosed instructions. Your monitor has  already been registered assigning a specific monitor serial # to you.   Billing and Patient Assistance Program information  Jean Gibson has been supplied with any insurance information on record for billing. [employer] offers a sliding scale Patient Assistance Program for patients without insurance, or whose  insurance does not completely cover the cost of the ZIO patch monitor. You must apply for the  Patient Assistance Program to qualify for the discounted rate. To apply, call [employer] at [phone],  select option 4, select option 2 , ask to apply for the Patient Assistance Program, (you can request an  interpreter  if needed). [employer] will ask your household income and how many people are in your  household. [employer] will quote your out-of-pocket cost based on this information. They will also be able  to set up a 12 month interest free payment plan if needed.  Applying the monitor   Shave hair from upper  left chest.  Hold the abrader disc by orange tab. Rub the abrader in 40 strokes over left upper chest as indicated in  your monitor instructions.  Clean area with 4 enclosed alcohol pads. Use all pads to ensure the area is cleaned thoroughly. Let  dry.  Apply patch as indicated in monitor instructions. Patch will be placed under collarbone on left side of  chest with arrow pointing upward.  Rub patch adhesive wings for 2 minutes. Remove the white label marked 1. Remove the white label  marked 2. Rub patch adhesive wings for 2 additional minutes.  While looking in a mirror, press and release button in center of patch. A small green light will flash 3-4  times. This will be your only indicator that the monitor has been turned on.  Do not shower for the first 24 hours. You may shower after the first 24 hours.  Press the button if you feel a symptom. You will hear a small click. Record Date, Time and Symptom in  the Patient Log.   Starting the Gateway  In your kit there is a audiological scientist box the size of a cellphone. This is [employer]. It transmits all your  recorded data to [facility]. This box must always stay within 10 feet of you. Open the box and push the *  button. There will be a light that blinks orange and then green a few times. When the light stops  blinking, the Gateway is connected to the ZIO patch. Call [employer] at [phone] to confirm your monitor is transmitting.  Returning your monitor  Remove your patch and place it inside the Gateway. In the lower half of the Gateway there is a white  bag with prepaid postage on it. Place Gateway in bag and seal. Mail package back to [place] as soon as  possible. Your physician should have your final report approximately 7 days after you have mailed back  your monitor. Call [facility] Customer Care at [phone] if you have questions regarding your ZIO AT  patch monitor. Call them immediately if you see an orange light  blinking on your monitor.  If your monitor falls off in less than 4 days, contact our Monitor department at [phone]. If your  monitor becomes loose or falls off after 4 days call [employer] at [phone] for suggestions on  securing your monitor  .   Follow-Up:  At [facility], you and your health needs are our priority.  As part of our continuing mission to provide you with exceptional heart care, our providers are all part of one team.  This team includes your primary Cardiologist (physician) and Advanced Practice Providers or APPs (Physician Assistants and Nurse Practitioners) who all work together to provide you with the care you need, when you need it.  Your next appointment:   Based on test results. Appointment to Structural Heart after echocardiogram is done.  Provider:       We recommend signing up for the patient portal called MyChart.  Sign up information is provided on this After Visit Summary.  MyChart is used to connect with patients for Virtual Visits (Telemedicine).  Patients are  able to view lab/test results, encounter notes, upcoming appointments, etc.  Non-urgent messages can be sent to your provider as well.   To learn more about what you can do with MyChart, go to [email or web address].   Other Instructions

## 2023-12-12 NOTE — Progress Notes (Signed)
 Cardiology Office Note   Date:  12/12/2023  ID:  Jean Gibson, DOB 1927-06-14, MRN [id number] PCP: Chinita Hoy LITTIE DEVONNA  [facility] Providers Cardiologist:  Lurena MARLA Red, MD Cardiology APP:  Carlin Delon BROCKS, NP     History of Present Illness Jean Gibson is a 89 y.o. female with a past medical history of aortic atherosclerosis, dyslipidemia, anemia.  Jean Gibson presents today accompanied by her daughter after a fall a few weeks ago that was caused by syncope.  On the day she fell, she woke up and was going to splash her face with water as she typically does, noticed that her head was  swimming, she then passed out and fell on top of a trash can >>  she regained consciousness, says she could not have been passed out for more than a few seconds as the bleeding was relatively fresh.  She was evaluated in the emergency department, required staples to the back of her scalp, CT of her head was unremarkable, labs revealed stable anemia, otherwise normal electrolytes.  It appears after her fall she was evaluated both by her PCP as well as Dr. Chales, one of her providers noticed a murmur prompting her to establish with our office.  They were told when she was at the emergency department she was probably dehydrated, she has been making a concerted effort to drink more water each day and is drinking at least 40 ounces daily now.  She continues to be bothered by dizziness most notably when she is initially changing positions from getting up in the morning or getting up after taking a nap but she has not had any further syncopal episodes.  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 She denies chest pain, palpitations, dyspnea, pnd, orthopnea, n, v, edema, weight gain, or early satiety.    ROS: Review of Systems  Neurological:  Positive for dizziness and loss of  consciousness.  All other systems reviewed and are negative.    Studies Reviewed          Risk Assessment/Calculations           Physical Exam VS:  BP 134/82   Pulse 74   Ht 4' 8 (1.422 m)   Wt 86 lb 12.8 oz (39.4 kg)   SpO2 98%   BMI 19.46 kg/m        Wt Readings from Last 3 Encounters:  12/12/23 86 lb 12.8 oz (39.4 kg)  04/06/16 95 lb (43.1 kg)    GEN: Well nourished, well developed in no acute distress NECK: No JVD; No carotid bruits CARDIAC: RRR, harsh systolic murmur 3/6 non-radiating to carotid arteries RESPIRATORY:  Clear to auscultation without rales, wheezing or rhonchi  ABDOMEN: Soft, non-tender, non-distended EXTREMITIES:  No edema; No deformity   ASSESSMENT AND PLAN Murmur - symptoms and murmur are consistent with aortic stenosis. She has never had any cardiac imaging. Will arrange for an echo as soon as possible. Advised her to continue to stay hydrated, change positions slowly. She is very youthful, independent, still works and we discussed referring her to the structural heart team after her echocardiogram.   Syncope and collapse - likely secondary to aortic stenosis and dehydration, for completeness, will arrange a live monitor for any contributory arrhythmias. Labs unrevealing for causes.        Dispo: Echocardiogram for murmur as soon as possible,  live monitor x 2 weeks. Follow up TBD based on testing.   Signed, Delon JAYSON Hoover, NP

## 2023-12-12 NOTE — Progress Notes (Unsigned)
 Enrolled patient for a 14 day Zio AT monitor to be mailed to patients home   Thukkani to read

## 2023-12-13 ENCOUNTER — Ambulatory Visit (HOSPITAL_COMMUNITY)
Admission: RE | Admit: 2023-12-13 | Discharge: 2023-12-13 | Disposition: A | Discharge status: Home / Self Care | Source: Ambulatory Visit | Attending: Cardiology | Admitting: Cardiology

## 2023-12-13 DIAGNOSIS — R011 Cardiac murmur, unspecified: Secondary | ICD-10-CM | POA: Insufficient documentation

## 2023-12-13 DIAGNOSIS — R55 Syncope and collapse: Secondary | ICD-10-CM | POA: Diagnosis present

## 2023-12-13 LAB — ECHOCARDIOGRAM COMPLETE
AR max vel: 0.67 cm2
AV Area VTI: 0.73 cm2
AV Area mean vel: 0.64 cm2
AV Mean grad: 29 mmHg
AV Peak grad: 47.9 mmHg
Ao pk vel: 3.46 m/s
Area-P 1/2: 3.42 cm2
S' Lateral: 2.37 cm

## 2023-12-14 ENCOUNTER — Ambulatory Visit: Payer: Self-pay | Admitting: Cardiology

## 2023-12-14 ENCOUNTER — Telehealth: Payer: Self-pay

## 2023-12-14 ENCOUNTER — Encounter: Payer: Self-pay | Admitting: Cardiology

## 2023-12-14 NOTE — Telephone Encounter (Signed)
 I called the patient's daughter Candis to review the recent echo results and schedule an appointment with a structural heart doctor to discuss TAVR, as referred by Delon Hoover, NP. Pt's daughter wants to discuss the results with her family and call back if they decide to schedule an appointment.

## 2023-12-26 ENCOUNTER — Telehealth: Payer: Self-pay | Admitting: Physician Assistant

## 2023-12-26 NOTE — Telephone Encounter (Signed)
   [employer] called the answering service after-hours today. Monitor autodetected possible new atrial flutter with HR 83bpm at 5:59pm, no symptoms reported at that time. I reviewed strips with Dr. Jeffrie. Top line raised question of possible atrial flutter though remainder of tracings appear most consistent with NSR. I tried to reach patient but got no reply so reached out to [phone] number listed - that daughter Candis was not on DPR so called daughter Dorthea instead per Joan's request as Candis is there with her to ask her. No symptoms noted. Patient will continue to wear monitor as advised.   In the absence of symptoms, no acute new orders placed, will route to ordering provider for review and advisement. Delon - can you also help the daytime office staff contact the patient to update her DPR to include Candis per family request? Thanks.         Jenner Rosier N Alba Perillo, PA-C

## 2024-01-04 ENCOUNTER — Ambulatory Visit: Attending: Cardiology | Admitting: Cardiology

## 2024-01-04 ENCOUNTER — Ambulatory Visit (HOSPITAL_COMMUNITY)
Admission: RE | Admit: 2024-01-04 | Discharge: 2024-01-04 | Disposition: A | Discharge status: Home / Self Care | Source: Ambulatory Visit | Attending: Cardiology | Admitting: Cardiology

## 2024-01-04 ENCOUNTER — Encounter: Payer: Self-pay | Admitting: Cardiology

## 2024-01-04 VITALS — BP 155/67 | HR 75 | Resp 16 | Ht <= 58 in | Wt 83.2 lb

## 2024-01-04 DIAGNOSIS — I350 Nonrheumatic aortic (valve) stenosis: Secondary | ICD-10-CM | POA: Diagnosis not present

## 2024-01-04 DIAGNOSIS — D508 Other iron deficiency anemias: Secondary | ICD-10-CM | POA: Diagnosis not present

## 2024-01-04 DIAGNOSIS — R55 Syncope and collapse: Secondary | ICD-10-CM

## 2024-01-04 DIAGNOSIS — R0989 Other specified symptoms and signs involving the circulatory and respiratory systems: Secondary | ICD-10-CM | POA: Insufficient documentation

## 2024-01-04 DIAGNOSIS — R030 Elevated blood-pressure reading, without diagnosis of hypertension: Secondary | ICD-10-CM

## 2024-01-04 NOTE — Patient Instructions (Addendum)
 Medication Instructions:   Your physician recommends that you continue on your current medications as directed. Please refer to the Current Medication list given to you today.   *If you need a refill on your cardiac medications before your next appointment, please call your pharmacy*  Lab Work: Fecal Occult Blood, Guaiac, Ferritin, Folate,  Iron and TIBC, TSH, Vitamin B12 and CBC (Today) If you have labs (blood work) drawn today and your tests are completely normal, you will receive your results only by: MyChart Message (if you have MyChart) OR A paper copy in the mail If you have any lab test that is abnormal or we need to change your treatment, we will call you to review the results.  Testing/Procedures: Echocardiogram   Your physician has requested that you have an echocardiogram. Echocardiography is a painless test that uses sound waves to create images of your heart. It provides your doctor with information about the size and shape of your heart and how well your heart's chambers and valves are working. This procedure takes approximately one hour. There are no restrictions for this procedure. Please do NOT wear cologne, perfume, aftershave, or lotions (deodorant is allowed). Please arrive 15 minutes prior to your appointment time.  Please note: We ask at that you not bring children with you during ultrasound (echo/ vascular) testing. Due to room size and safety concerns, children are not allowed in the ultrasound rooms during exams. Our front office staff cannot provide observation of children in our lobby area while testing is being conducted. An adult accompanying a patient to their appointment will only be allowed in the ultrasound room at the discretion of the ultrasound technician under special circumstances. We apologize for any inconvenience.   Chest Xray  Your physician has requested that you have an chest x-ray. A chest x-ray takes a picture of the organs and structures inside  the chest, including the heart, lungs, and blood vessels. This test can show several things, including, whether the heart is enlarges; whether fluid is building up in the lungs; and whether pacemaker / defibrillator leads are still in place.   Follow-Up: At [facility], you and your health needs are our priority.  As part of our continuing mission to provide you with exceptional heart care, our providers are all part of one team.  This team includes your primary Cardiologist (physician) and Advanced Practice Providers or APPs (Physician Assistants and Nurse Practitioners) who all work together to provide you with the care you need, when you need it.  Your next appointment:   6 month(s)  Provider:   Dr. Gordy Bergamo

## 2024-01-04 NOTE — Progress Notes (Signed)
 Cardiology Office Note:  .   Date:  01/04/2024  ID:  Jean Gibson, DOB 02-Nov-1927, MRN [id number] PCP: Chinita Hoy LITTIE DEVONNA  [facility] Providers Cardiologist:  Gordy Bergamo, MD Cardiology APP:  Carlin Delon BROCKS, NP   History of Present Illness: Jean Gibson   Jean Gibson is a 89 y.o. patient presented with 1 episode of syncope on 12/01/2023, when she got up from bed, was washing her face when she felt dizzy and swimmy followed by syncope and had a laceration on her scalp needing ED visit.  Just prior to this she was feeling slightly tired and had received flu vaccine and had lab work done as well.  Laceration in the left temporal area about 2-1/2 cm.  In the interim after her evaluation by her PCP, she was found to have aortic stenotic murmur and was referred to us .  Underwent echocardiogram on 12/13/2022 revealing moderate aortic stenosis with a mean gradient of 29 mmHg.  She now presents for follow-up.  She just completed wearing Zio patch monitoring for 2 weeks.  There was 1 call out regarding possible a flutter but evaluation of EKG revealed brief atrial tachycardia and essentially sinus rhythm.  She is accompanied by her 2 daughters and her son.  She has not had any further recurrence of dizziness, syncope or near syncope.  She is essentially asymptomatic and states that she has returned back to her usual baseline state.  No chest pain or dyspnea.  She denies melena, blood in stool or abdominal discomfort.  Cardiac Studies relevent.    ECHOCARDIOGRAM COMPLETE 12/13/2023  1. Left ventricular ejection fraction, by estimation, is 60 to 65%. The left ventricle has normal function. The left ventricle has no regional wall motion abnormalities. There is moderate left ventricular hypertrophy. Left ventricular diastolic parameters are consistent with Grade I diastolic dysfunction (impaired relaxation). 2. Right ventricular systolic function is normal. The right ventricular size is normal. 3.  Left atrial size was severely dilated. 4. The mitral valve is normal in structure. Mild mitral valve regurgitation. No evidence of mitral stenosis. 5. DI-0.20. The aortic valve is calcified. There is severe calcifcation of the aortic valve. There is severe thickening of the aortic valve. Aortic valve regurgitation is mild. Moderate aortic valve stenosis. Aortic valve area, by VTI measures 0.73 cm. Aortic valve mean gradient measures 29.0 mmHg. Aortic valve Vmax measures 3.46 m/s. 6. The inferior vena cava is normal in size with greater than 50% respiratory variability, suggesting right atrial pressure of 3 mmHg.  Images personally reviewed, CW Doppler revealed almost deformity jet consistent with a peak velocity of 64 mmHg.  Dimensionless index of 0.22.  Consistent with severe arctic stenosis.    Discussed the use of AI scribe software for clinical note transcription with the patient, who gave verbal consent to proceed.  History of Present Illness Jean Gibson is a 89 year old female with aortic stenosis who presents with a recent episode of syncope. She is accompanied by her daughter.  On December 01, 2023, she experienced syncope while washing her face, resulting in a fall and brief loss of consciousness. There were no premonitory symptoms such as dizziness or lightheadedness. Since then, she has not had further syncope or pre-syncope symptoms.  She wore a cardiac monitor for several weeks following the episode, with no recurrence of syncope or pre-syncope symptoms during this period. She has returned to her normal activities without difficulty.  She has aortic stenosis. Her blood pressure is slightly elevated during medical  appointments but typically normal at home.  Labs   Recent Labs    12/01/23 0905  NA 139  K 4.0  CL 102  CO2 27  GLUCOSE 149*  BUN 30*  CREATININE 0.64  CALCIUM 9.5  GFRNONAA >60    Lab Results  Component Value Date   ALT 53 (H) 12/01/2023   AST 36  12/01/2023   ALKPHOS 132 (H) 12/01/2023   BILITOT 0.4 12/01/2023      Latest Ref Rng & Units 12/01/2023    9:05 AM 06/15/2021    8:55 PM 06/15/2010   11:02 AM  CBC  WBC 4.0 - 10.5 K/uL 6.1  10.1  8.0   Hemoglobin 12.0 - 15.0 g/dL 89.3  88.1  9.8   Hematocrit 36.0 - 46.0 % 34.2  36.8  30.8   Platelets 150 - 400 K/uL 216  188  162    Care everywhere/Faxed External Labs:  Labs 11/30/2023:  A1c 5.4%.  Labs 12/29/2022:  Total cholesterol 210, triglycerides 52, HDL 83, LDL 118.  Vitamin D89.6.  ROS  Review of Systems  Cardiovascular:  Negative for chest pain, dyspnea on exertion and leg swelling.   Physical Exam:   VS:  BP (!) 155/67 (BP Location: Left Arm, Patient Position: Sitting, Cuff Size: Small)   Pulse 75   Resp 16   Ht 4' 8 (1.422 m)   Wt 83 lb 3.2 oz (37.7 kg)   SpO2 98%   BMI 18.65 kg/m    Wt Readings from Last 3 Encounters:  01/04/24 83 lb 3.2 oz (37.7 kg)  12/12/23 86 lb 12.8 oz (39.4 kg)  04/06/16 95 lb (43.1 kg)    BP Readings from Last 3 Encounters:  01/04/24 (!) 155/67  12/12/23 134/82  12/01/23 (!) 157/77   Physical Exam Neck:     Vascular: No JVD.  Cardiovascular:     Rate and Rhythm: Normal rate and regular rhythm.     Pulses: Intact distal pulses.          Dorsalis pedis pulses are 1+ on the right side and 1+ on the left side.       Posterior tibial pulses are 0 on the right side and 0 on the left side.     Heart sounds: S1 normal and S2 normal. Murmur heard.     Harsh midsystolic murmur is present with a grade of 3/6 at the upper right sternal border.     No gallop.  Pulmonary:     Effort: Pulmonary effort is normal.     Breath sounds: Normal breath sounds.  Abdominal:     General: Bowel sounds are normal.     Palpations: Abdomen is soft.  Musculoskeletal:     Right lower leg: No edema.     Left lower leg: No edema.    EKG:       EKG 12/01/2023: Normal sinus rhythm at rate of 96 bpm, left anterior fascicular block.  Poor R wave  progression, cannot exclude anteroseptal infarct old.  No prior EKG to compare.  ASSESSMENT AND PLAN: .      ICD-10-CM   1. Syncope and collapse  R55 CBC    TSH    ECHOCARDIOGRAM COMPLETE    Fecal occult blood, imunochemical([facility])    2. Moderate aortic stenosis  I35.0 Fecal occult blood, imunochemical([facility])    3. Abnormal lung sounds  R09.89 DG Chest 2 View    Fecal occult blood, imunochemical([facility])    CANCELED: DG Chest 2 View  4. Iron deficiency anemia secondary to inadequate dietary iron intake  D50.8 Vitamin B12    Folate    Iron and TIBC    Ferritin    CBC    Fecal occult blood, imunochemical([facility])    CANCELED: Fecal Occult Blood, Guaiac    CANCELED: Fecal Occult Blood, Guaiac    CANCELED: Fecal Occult Blood, Guaiac    5. White coat syndrome without diagnosis of hypertension  R03.0 Fecal occult blood, imunochemical([facility])     Assessment & Plan Syncope episode, possible vasovagal or cardiac etiology Syncope episode occurred approximately three weeks ago with no prior symptoms. Differential diagnosis includes vasovagal syncope, cardiac arrhythmia, or aortic stenosis. No recurrence of syncope since the initial episode. Monitor results pending. Advised against driving due to risk of recurrence. - Await monitor results - Advised against driving for six months - Instructed to call if another syncope episode occurs - Etiology for syncope includes conduction system abnormality, moderate to severe aortic stenosis, vasovagal episode.  Low threshold for placing loop recorder in view of advanced age.  Moderate to moderately severe nonrheumatic aortic valve stenosis Aortic valve stenosis is moderate to moderately severe, potentially contributing to syncope. Valve replacement may be necessary if symptoms worsen. Discussed transcatheter aortic valve replacement (TAVR) as a less invasive option compared to open heart surgery. -  Will repeat echocardiogram in six months - Monitor for symptoms of syncope or worsening stenosis  Iron deficiency anemia Hemoglobin has decreased by 1-2 grams over the past year. Possible GI bleeding due to aortic stenosis and colonic diverticulosis and AV malformation. - Ordered stool test to check for GI bleeding - Ordered CBC and anemia panel including B12 and folic acid levels  Urinary incontinence Experiencing urinary incontinence, particularly at night. Currently taking medication to reduce frequency. Discussed potential benefits of using diapers at night to improve quality of life. - Continue current medication Myrbetriq for urinary incontinence - Consider using diapers at night to improve quality of life  Pulmonary crackles, left lung (rule out pulmonary fibrosis) Crackles noted on the left lung, possibly due to scar tissue or past pneumonia. No recent chest x-ray available. - Ordered chest x-ray to evaluate left lung crackles  Follow up: 6 months with repeat echocardiogram.  Signed,  Gordy Bergamo, MD, [facility] 01/04/2024, 4:11 PM [facility] [address] [place], [place] [address] Phone: [phone]. Fax:  [phone]

## 2024-01-05 ENCOUNTER — Ambulatory Visit: Payer: Self-pay | Admitting: Cardiology

## 2024-01-05 LAB — IRON AND TIBC
Iron Saturation: 12 % — ABNORMAL LOW (ref 15–55)
Iron: 39 ug/dL (ref 27–139)
Total Iron Binding Capacity: 332 ug/dL (ref 250–450)
UIBC: 293 ug/dL (ref 118–369)

## 2024-01-05 LAB — TSH: TSH: 3.75 u[IU]/mL (ref 0.450–4.500)

## 2024-01-05 LAB — CBC
Hematocrit: 35.9 % (ref 34.0–46.6)
Hemoglobin: 11.2 g/dL (ref 11.1–15.9)
MCH: 28.2 pg (ref 26.6–33.0)
MCHC: 31.2 g/dL — ABNORMAL LOW (ref 31.5–35.7)
MCV: 90 fL (ref 79–97)
Platelets: 229 x10E3/uL (ref 150–450)
RBC: 3.97 x10E6/uL (ref 3.77–5.28)
RDW: 13.4 % (ref 11.7–15.4)
WBC: 7.3 x10E3/uL (ref 3.4–10.8)

## 2024-01-05 LAB — VITAMIN B12: Vitamin B-12: >2000 pg/mL — ABNORMAL HIGH (ref 232–1245)

## 2024-01-05 LAB — FOLATE: Folate: >20 ng/mL (ref >3.0)

## 2024-01-05 LAB — FERRITIN: Ferritin: 120 ng/mL (ref 15–150)

## 2024-01-05 NOTE — Progress Notes (Signed)
 Blood counts are within normal limits, hemoglobin has improved as well.  No change in the plans for now, continue present management.

## 2024-01-09 LAB — FECAL OCCULT BLOOD, IMMUNOCHEMICAL: Fecal Occult Bld: NEGATIVE

## 2024-01-10 ENCOUNTER — Encounter: Payer: Self-pay | Admitting: Cardiology

## 2024-01-10 ENCOUNTER — Other Ambulatory Visit: Payer: Self-pay

## 2024-01-10 DIAGNOSIS — J984 Other disorders of lung: Secondary | ICD-10-CM

## 2024-01-12 DIAGNOSIS — R55 Syncope and collapse: Secondary | ICD-10-CM | POA: Diagnosis not present

## 2024-01-16 ENCOUNTER — Telehealth: Payer: Self-pay | Admitting: Internal Medicine

## 2024-01-16 ENCOUNTER — Encounter: Payer: Self-pay | Admitting: Internal Medicine

## 2024-01-16 ENCOUNTER — Telehealth: Payer: Self-pay | Admitting: Cardiology

## 2024-01-16 NOTE — Telephone Encounter (Signed)
 Dr Ladona contactted and requested a non urgen ILD consult.   Plan  - HRCT   - blood work ANA, RF, SSA, SSB, CCP, DSDNA, SCl-70  - first avail but  < 8 weeks

## 2024-01-16 NOTE — Telephone Encounter (Signed)
 Spoke with Rolan Mcburney Rep who reports pt's monitor has posted final report with one change.  On 12/26/2023 at 559pm transmission originally interpreted as Aflutter now changed to SVT with artifact. Final report available for review in EPIC.

## 2024-01-16 NOTE — Telephone Encounter (Signed)
 [employer] calling with  urgent patient results. Please advise.

## 2024-01-16 NOTE — Telephone Encounter (Signed)
 ATC 1x Left VM sent LTR

## 2024-01-17 NOTE — Telephone Encounter (Signed)
 FYI

## 2024-01-17 NOTE — Telephone Encounter (Signed)
 PT refused to be seen.

## 2024-02-07 NOTE — Telephone Encounter (Signed)
 Well I am seeing an appt for 02/23/24

## 2024-02-20 ENCOUNTER — Encounter: Payer: Self-pay | Admitting: Internal Medicine

## 2024-02-22 NOTE — Telephone Encounter (Signed)
 Dr. Geronimo, Please see message from patient's daughters.  This is just an FYI prior to her appointment on 1/9.  Thank you.

## 2024-02-22 NOTE — Telephone Encounter (Signed)
 Most definitely yes. Appreciate the heads up. Will deal with utmost care

## 2024-02-23 ENCOUNTER — Ambulatory Visit: Admitting: Internal Medicine

## 2024-02-23 ENCOUNTER — Encounter: Payer: Self-pay | Admitting: Internal Medicine

## 2024-02-23 VITALS — BP 136/78 | HR 83 | Ht <= 58 in | Wt 86.0 lb

## 2024-02-23 DIAGNOSIS — Z9189 Other specified personal risk factors, not elsewhere classified: Secondary | ICD-10-CM

## 2024-02-23 DIAGNOSIS — T50B95A Adverse effect of other viral vaccines, initial encounter: Secondary | ICD-10-CM

## 2024-02-23 DIAGNOSIS — R0989 Other specified symptoms and signs involving the circulatory and respiratory systems: Secondary | ICD-10-CM | POA: Diagnosis not present

## 2024-02-23 DIAGNOSIS — R9389 Abnormal findings on diagnostic imaging of other specified body structures: Secondary | ICD-10-CM

## 2024-02-23 DIAGNOSIS — R918 Other nonspecific abnormal finding of lung field: Secondary | ICD-10-CM

## 2024-02-23 MED ORDER — AZELASTINE HCL 0.1 % NA SOLN: 2.0000 | Freq: Two times a day (BID) | NASAL | 12 refills | Status: AC

## 2024-02-23 NOTE — Progress Notes (Signed)
 "      OV 02/23/2024  Subjective:  Patient ID: Jean Gibson, female , DOB: 1927-05-03 , age 89 y.o. , MRN: [id number] , ADDRESS: [address] [place] [address] PCP Jean Hoy CROME, PA-C Patient Care Team: Jean Gibson as PCP - General (Physician Assistant) Jean Heinz, MD as PCP - Cardiology (Cardiology) Carlin Delon BROCKS, NP (Inactive) as Nurse Practitioner (Cardiology)  This Provider for this visit: Treatment Team:  Attending Provider: Geronimo Amel, MD    02/23/2024 -   Chief Complaint  Patient presents with   Consult    Referred by Dr. Heinz Gibson for eval of possible PF.       HPI Jean Gibson y.o. -Jean Gibson is a 89 year old female who presents with  concerns about potential lung scar tissue. She was referred by Dr. Ladona for evaluation of a fall and potential lung issues.  She generally feels well with a good appetite and high energy levels, remaining active by performing her own housework and frequently using stairs in her home. However, she experienced a fall around fall 2025g after feeling dizzy. The dizziness occurred immediately after getting out of bed and splashing cold water on her face, leading to a fall where she hit her head on a hard plastic trash can, breaking it. She had received a flu shot the day before the fall. She has never fainted before or since this incident.  Following the fall, a workup was initiated to determine the cause. She underwent monitoring and an x-ray was performed, but she has not received the results yet.  In my personal visualization of this chest x-ray there might be some basal nonspecific reticulation suggesting of ILD.  She does not take any medications for blood pressure and has not experienced any further episodes of dizziness or fainting. No shortness of breath, cough, or wheezing, and she maintains a high level of physical activity.  She cooks.  She goes down a flight of stairs and comes back never  symptomatic.  She has a history of a CT scan of the abdomen from nine years ago, but no recent CT scan of the chest.  When I personally visualized this there is ILA in this particularly in the left lower base.  Daughter is informed today that her family reports finding mold in her home, particularly in the room where she sleeps, which has been addressed by removing the affected areas down to the studs. There is no report of respiratory symptoms such as wheezing or cough related to the mold exposure.  However she has significant exposure all her life to using feather based comforter and pillow  Current symptom score and walking desaturation test performance is below  Family and her did have questions about respiratory hygiene.  Also they discussed strategy towards flu vaccine next year.  They are willing to try a low-dose short.  Because she is short and frail they feel a high-dose shot was too much but they do not think the flu shot in itself is a problem because she has had this in the past and they think it is a dosing issue.   She denied runny nose or postnasal drip but the daughters did indicate that she tends to use a tissue and blow her nose out quite a bit.  Discussed recent study from [place] showing reduced risk for getting COVID with Astelin  nasal spray.  Overall she is fit and healthy.  She attributes her longevity to eating healthy food which  is mainly Lebanese food and having high integrity.  SYMPTOM SCALE - ILD 02/23/2024  Current weight   O2 use ra0  Shortness of Breath 0 -> 5 scale with 5 being worst (score 6 If unable to do)  At rest 0  Simple tasks - showers, clothes change, eating, shaving 0  Household (dishes, doing bed, laundry) 0  Shopping 0  Walking level at own pace 0  Walking up Stairs 0  Total (30-36) Dyspnea Score 0  How bad is your cough? 0  How bad is your fatigue 0  How bad is appetiee 0  How bad is nausea 0  How bad is vomiting?  0  How bad is diarrhea?  0  How bad is anxiety? 0  How bad is depression 0  Any chronic pain - if so where and how bad 0      Simple office walk 224 (66+46 x 2) feet Pod A at [facility] x  3 laps goal with forehead probe 02/23/2024    O2 used ra   Number laps completed 2 of 3 laps. CMA terminated   Comments about pace Moderate. INdepdent   Resting Pulse Ox/HR 96% and 83/min   Final Pulse Ox/HR 91% and 94/min   Desaturated </= 88% no   Desaturated <= 3% points yes   Got Tachycardic >/= 90/min yes   Symptoms at end of test asymptmatic   Miscellaneous comments Tendnce to desaturate+       EXAM: 2 VIEW(S) XRAY OF THE CHEST 01/04/2024 02:30:29 PM   COMPARISON: None available.   CLINICAL HISTORY: Abnormal lung sounds   FINDINGS:   LUNGS AND PLEURA: Mild diffuse reticular changes interstitial opacity at the periphery and bases. Apical pleural thickening. No focal pulmonary opacity. No pleural effusion. No pneumothorax.   HEART AND MEDIASTINUM: Cardiomegaly. Calcified aorta.   BONES AND SOFT TISSUES: Osteopenia and degenerative changes of spine. Old fracture deformity of right humeral neck. Age indeterminate mild-to-moderate compression deformity at the thoracolumbar junction.   IMPRESSION: 1. See reticular interstitial opacity at the periphery and bases, which are suggestive of chronic interstitial lung disease. This may be correlated with high-resolution chest CT if deemed appropriate. 2. Cardiomegaly with aortic atherosclerosis.   Electronically signed by: Luke Bun MD 01/10/2024 12:32 AM EST RP Workstation: [id number]    PFT      No data to display             LAB RESULTS last 96 hours No results found.       has a past medical history of Aortic stenosis and Murmur.   reports that she has never smoked. She has been exposed to tobacco smoke. She has never used smokeless tobacco.  No past surgical history on file.  Allergies[1]  Immunization History   Administered Date(s) Administered   PFIZER(Purple Top)SARS-COV-2 Vaccination 02/21/2019, 03/14/2019   Pfizer Covid-19 Vaccine Bivalent Booster 84yrs & up 12/16/2020   Pfizer(Comirnaty)Fall Seasonal Vaccine 12 years and older 12/07/2021   Tdap 12/01/2023    No family history on file.  Current Medications[2]      Objective:   Vitals:   02/23/24 0901  BP: 136/Gibson  Pulse: 83  SpO2: 100%  Weight: 86 lb (39 kg)  Height: 4' 8 (1.422 m)    Estimated body mass index is 19.28 kg/m as calculated from the following:   Height as of this encounter: 4' 8 (1.422 m).   Weight as of this encounter: 86 lb (39 kg).  @WEIGHTCHANGE @  [facility]  Weights   02/23/24 0901  Weight: 86 lb (39 kg)     Physical Exam   General: No distress. Looks well O2 at rest: no Cane present: no Sitting in wheel chair: no Frail: yes Obese: no Neuro: Alert and Oriented x 3. GCS 15. Speech normal Psych: Pleasant Resp:  Barrel Chest - no.  Wheeze - no, Crackles - yes base, No overt respiratory distress CVS: Normal heart sounds. Murmurs - no Ext: Stigmata of Connective Tissue Disease - no HEENT: Normal upper airway. PEERL +. No post nasal drip        Assessment/     Assessment & Plan Runny nose  Interstitial lung abnormality (ILA)  Abnormal chest x-ray  Bibasilar crackles  At risk for respiratory infection  Adverse effect of influenza vaccine, initial encounter    PLAN Patient Instructions  Runny nose  Plan - astelin  nasal spray  Abnormal chest x-ray Bibasilar crackles Interstial Abnormality  - there might be some scar in lung based on CT 9 years ago. Do not believe it is affecting you significantly  Plan  - get HRCT and will cal with results  At risk for respiratory illnesses  - currently no  evidence that mold or bird feathers are affecting you significantly  Plan  - get rid of mold in house  - get rid of all down sofa, comforters, blankets  -Avoid respiratory illness  sick exposure and control your risk for respiratory infection  - be uptodate with all respiratory vaccines  - avoid sick contacts especially in areas of indoor clusters (churches, weddings, funerals, family gatherings, birthdays, planes, malls, indoor areas especially)   - mask in these areas   - discourage sick people from coming in close contact with you    Flu vaccine side effect   - possible syncope in oct 2025 erelated to high dose flu shot  Plan  - recommend low dose flu shot and 24h close family observation in 2026    Followup  - will call Joan/Cindy and update followup need     FOLLOWUP    Return for  - will call Joan/Cindy and update followup need.    SIGNATURE    Dr. Dorethia Cave, M.D., F.C.C.P,  Pulmonary and Critical Care Medicine Staff Physician, [facility] Center Director - Interstitial Lung Disease  Program  Pulmonary Fibrosis [facility] at [facility] [place], [place], [address]  Pager: [phone], If no answer or between  15:00h - 7:00h: call [phone] Telephone: [phone]  9:49 AM 02/23/2024     [1] No Known Allergies [2]  Current Outpatient Medications:    azelastine  (ASTELIN ) 0.1 % nasal spray, Place 2 sprays into both nostrils 2 (two) times daily. Use in each nostril as directed, Disp: 30 mL, Rfl: 12   Calcium Carbonate (CALTRATE 600 PO), Take 1 tablet by mouth daily., Disp: , Rfl:    Calcium Carbonate-Vitamin D 600-400 MG-UNIT tablet, Take by mouth., Disp: , Rfl:    cyanocobalamin (VITAMIN B12) 1000 MCG tablet, Take 1,000 mcg by mouth daily., Disp: , Rfl:    ELDERBERRY PO, Take by mouth daily., Disp: , Rfl:    Multiple Vitamins-Minerals (CENTRUM SILVER PO), Take by mouth., Disp: , Rfl:    VITAMIN E PO, Take 1 capsule by mouth daily., Disp: , Rfl:    aspirin 81 MG chewable tablet, Chew by mouth daily. (Patient not taking: Reported on 02/23/2024), Disp: , Rfl:    MYRBETRIQ  25 MG TB24 tablet, Take 25  mg by mouth daily. (Patient not taking: Reported on 02/23/2024), Disp: , Rfl:   "

## 2024-02-23 NOTE — Patient Instructions (Addendum)
 Runny nose  Plan - astelin  nasal spray  Abnormal chest x-ray Bibasilar crackles Interstial Abnormality  - there might be some scar in lung based on CT 9 years ago. Do not believe it is affecting you significantly  Plan  - get HRCT and will cal with results  At risk for respiratory illnesses  - currently no  evidence that mold or bird feathers are affecting you significantly  Plan  - get rid of mold in house  - get rid of all down sofa, comforters, blankets  -Avoid respiratory illness sick exposure and control your risk for respiratory infection  - be uptodate with all respiratory vaccines  - avoid sick contacts especially in areas of indoor clusters (churches, weddings, funerals, family gatherings, birthdays, planes, malls, indoor areas especially)   - mask in these areas   - discourage sick people from coming in close contact with you    Flu vaccine side effect   - possible syncope in oct 2025 erelated to high dose flu shot  Plan  - recommend low dose flu shot and 24h close family observation in 2026    Followup  - will call Joan/Cindy and update followup need

## 2024-02-26 ENCOUNTER — Inpatient Hospital Stay: Admission: RE | Admit: 2024-02-26 | Discharge: 2024-02-26 | Attending: Internal Medicine | Admitting: Internal Medicine

## 2024-02-26 DIAGNOSIS — T50B95A Adverse effect of other viral vaccines, initial encounter: Secondary | ICD-10-CM

## 2024-02-26 DIAGNOSIS — R9389 Abnormal findings on diagnostic imaging of other specified body structures: Secondary | ICD-10-CM

## 2024-02-26 DIAGNOSIS — Z9189 Other specified personal risk factors, not elsewhere classified: Secondary | ICD-10-CM

## 2024-02-26 DIAGNOSIS — R0989 Other specified symptoms and signs involving the circulatory and respiratory systems: Secondary | ICD-10-CM

## 2024-02-26 DIAGNOSIS — R918 Other nonspecific abnormal finding of lung field: Secondary | ICD-10-CM

## 2024-02-29 ENCOUNTER — Ambulatory Visit: Payer: Self-pay | Admitting: Internal Medicine

## 2024-02-29 NOTE — Telephone Encounter (Signed)
" °  There eis ILD Definitely different and more versus 2016 Overall still MILD Need to call daughters   CT Chest High Resolution Result Date: 02/28/2024 CLINICAL DATA:  Interstitial lung abnormality, mold exposure, abnormal chest radiograph. EXAM: CT CHEST WITHOUT CONTRAST TECHNIQUE: Multidetector CT imaging of the chest was performed following the standard protocol without intravenous contrast. High resolution imaging of the lungs, as well as inspiratory and expiratory imaging, was performed. RADIATION DOSE REDUCTION: This exam was performed according to the departmental dose-optimization program which includes automated exposure control, adjustment of the mA and/or kV according to patient size and/or use of iterative reconstruction technique. COMPARISON:  Chest radiograph 01/04/2024. FINDINGS: Cardiovascular: Atherosclerotic calcification of the aorta and aortic valve. Heart is enlarged. No pericardial effusion. Mediastinum/Nodes: Mediastinal lymph nodes measure up to 11 mm in the low right paratracheal station. Hilar regions are difficult to definitively evaluate without IV contrast. No axillary adenopathy. Esophagus is grossly unremarkable. Lungs/Pleura: Image quality is degraded by respiratory motion. Peripheral and basilar predominant subpleural reticulation, traction bronchiectasis/bronchiolectasis and coarsened ground-glass. No definitive honeycombing. Findings occupy greater than 5% of the lung parenchyma. There may be minimal pleural thickening in the posteromedial left hemithorax. No pleural fluid. Airway is unremarkable. Expiratory phase imaging was not performed in true expiration, limiting the evaluation for air trapping. Upper Abdomen: Visualized portions of the liver, adrenal glands, spleen stomach and bowel are grossly unremarkable. Musculoskeletal: Osteopenia. Degenerative changes in the spine. L1 compression fracture is likely old. IMPRESSION: 1. 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 Findings are categorized as probable UIP per consensus guidelines: Diagnosis of Idiopathic Pulmonary Fibrosis: An Official ATS/ERS/JRS/ALAT Clinical Practice Guideline. Am JINNY Honey Crit Care Med Vol 198, Iss 5, [phone], Oct 15 2016. 2. Borderline enlarged mediastinal lymph nodes, likely reactive in the setting of interstitial lung disease. 3.  Aortic atherosclerosis (ICD10-I70.0). Electronically Signed   By: Newell Eke M.D.   On: 02/28/2024 09:29    "

## 2024-03-05 NOTE — Telephone Encounter (Signed)
 Spoke to daughter Dorthea. She is going to [place] I 2 weeks and back mid feb 2026. Daugher is open to anti-fibrotic - low dose jascayd but needs to come in so patient can get the news and discus .Needs HP and limited CTD panel too   Diease is mild but has progressed since 2016 inmo  Plan  - visit iwht Tammy or BEth or MR next 2 weeeks befer her [place] trip

## 2024-03-12 ENCOUNTER — Ambulatory Visit: Admitting: Internal Medicine

## 2024-03-12 ENCOUNTER — Encounter: Payer: Self-pay | Admitting: Internal Medicine

## 2024-03-12 VITALS — BP 112/78 | HR 76 | Temp 97.8°F | Ht <= 58 in | Wt 84.8 lb

## 2024-03-12 DIAGNOSIS — Z5181 Encounter for therapeutic drug level monitoring: Secondary | ICD-10-CM

## 2024-03-12 DIAGNOSIS — J84112 Idiopathic pulmonary fibrosis: Secondary | ICD-10-CM

## 2024-03-12 NOTE — Progress Notes (Signed)
 "     OV 02/23/2024  Subjective:  Patient ID: Jean Gibson, female , DOB: 23-Oct-1927 , age 89 y.o. , MRN: [id number] , ADDRESS: [address] [place] [address] PCP Chinita Hoy CROME, PA-C Patient Care Team: Chinita Hoy CROME DEVONNA as PCP - General (Physician Assistant) Ladona Heinz, MD as PCP - Cardiology (Cardiology) Carlin Delon BROCKS, NP (Inactive) as Nurse Practitioner (Cardiology)  This Provider for this visit: Treatment Team:  Attending Provider: Geronimo Amel, MD    02/23/2024 -   Chief Complaint  Patient presents with   Consult    Referred by Dr. Heinz Schwalbe for eval of possible PF.       HPI Jean Gibson 89 y.o. -Jean Gibson is a 89 year old female who presents with  concerns about potential lung scar tissue. She was referred by Dr. Ladona for evaluation of a fall and potential lung issues.  She generally feels well with a good appetite and high energy levels, remaining active by performing her own housework and frequently using stairs in her home. However, she experienced a fall around fall 2025g after feeling dizzy. The dizziness occurred immediately after getting out of bed and splashing cold water on her face, leading to a fall where she hit her head on a hard plastic trash can, breaking it. She had received a flu shot the day before the fall. She has never fainted before or since this incident.  Following the fall, a workup was initiated to determine the cause. She underwent monitoring and an x-ray was performed, but she has not received the results yet.  In my personal visualization of this chest x-ray there might be some basal nonspecific reticulation suggesting of ILD.  She does not take any medications for blood pressure and has not experienced any further episodes of dizziness or fainting. No shortness of breath, cough, or wheezing, and she maintains a high level of physical activity.  She cooks.  She goes down a flight of stairs and comes back never  symptomatic.  She has a history of a CT scan of the abdomen from nine years ago, but no recent CT scan of the chest.  When I personally visualized this there is ILA in this particularly in the left lower base.  Daughter is informed today that her family reports finding mold in her home, particularly in the room where she sleeps, which has been addressed by removing the affected areas down to the studs. There is no report of respiratory symptoms such as wheezing or cough related to the mold exposure.  However she has significant exposure all her life to using feather based comforter and pillow  Current symptom score and walking desaturation test performance is below  Family and her did have questions about respiratory hygiene.  Also they discussed strategy towards flu vaccine next year.  They are willing to try a low-dose short.  Because she is short and frail they feel a high-dose shot was too much but they do not think the flu shot in itself is a problem because she has had this in the past and they think it is a dosing issue.   She denied runny nose or postnasal drip but the daughters did indicate that she tends to use a tissue and blow her nose out quite a bit.  Discussed recent study from [place] showing reduced risk for getting COVID with Astelin  nasal spray.  Overall she is fit and healthy.  She attributes her longevity to eating healthy food which is  mainly Lebanese food and having high integrity.    EXAM: 2 VIEW(S) XRAY OF THE CHEST 01/04/2024 02:30:29 PM   COMPARISON: None available.   CLINICAL HISTORY: Abnormal lung sounds   FINDINGS:   LUNGS AND PLEURA: Mild diffuse reticular changes interstitial opacity at the periphery and bases. Apical pleural thickening. No focal pulmonary opacity. No pleural effusion. No pneumothorax.   HEART AND MEDIASTINUM: Cardiomegaly. Calcified aorta.   BONES AND SOFT TISSUES: Osteopenia and degenerative changes of spine. Old fracture  deformity of right humeral neck. Age indeterminate mild-to-moderate compression deformity at the thoracolumbar junction.   IMPRESSION: 1. See reticular interstitial opacity at the periphery and bases, which are suggestive of chronic interstitial lung disease. This may be correlated with high-resolution chest CT if deemed appropriate. 2. Cardiomegaly with aortic atherosclerosis.   Electronically signed by: Luke Bun MD 01/10/2024 12:32 AM EST RP Workstation: [id number]     OV 03/12/2024  Subjective:  Patient ID: Jean Gibson, female , DOB: [birth year] , age 69 y.o. , MRN: [id number] , ADDRESS: [address] [place] [address] PCP Chinita Hoy CROME, PA-C Patient Care Team: Chinita Hoy CROME DEVONNA as PCP - General (Physician Assistant) Ladona Heinz, MD as PCP - Cardiology (Cardiology) Carlin Delon BROCKS, NP as Nurse Practitioner (Cardiology)  This Provider for this visit: Treatment Team:  Attending Provider: Geronimo Amel, MD    03/12/2024 -   Chief Complaint  Patient presents with   Interstitial Lung Disease    Pt states since LOV breathing has been very good     HPI Jean Gibson 89 y.o. -presents for follow-up with daughter Dorthea and Candis to discuss CT results. /Interim Health status: No new complaints No new medical problems. No new surgeries. No ER visits. No Urgent care visits. No changes to medications she continues to be asymptomatic.  She has upcoming trip to [place] in a few days.  This is her annual trip at her timeshare.  She goes for 2-1/2 weeks.  I personally visualized the CT and there is definite ILD it is mild.  There is progression compared to some years ago.  Showed slow progressive.   She denies any symptoms of connective tissue disease.    SYMPTOM SCALE - ILD 02/23/2024 and same 03/12/2024   Current weight   O2 use ra0  Shortness of Breath 0 -> 5 scale with 5 being worst (score 6 If unable to do)  At rest 0  Simple tasks - showers,  clothes change, eating, shaving 0  Household (dishes, doing bed, laundry) 0  Shopping 0  Walking level at own pace 0  Walking up Stairs 0  Total (30-36) Dyspnea Score 0  How bad is your cough? 0  How bad is your fatigue 0  How bad is appetiee 0  How bad is nausea 0  How bad is vomiting?  0  How bad is diarrhea? 0  How bad is anxiety? 0  How bad is depression 0  Any chronic pain - if so where and how bad 0      Simple office walk 224 (66+46 x 2) feet Pod A at [facility] x  3 laps goal with forehead probe 02/23/2024    O2 used ra   Number laps completed 2 of 3 laps. CMA terminated   Comments about pace Moderate. INdepdent   Resting Pulse Ox/HR 96% and 83/min   Final Pulse Ox/HR 91% and 94/min   Desaturated </= 88% no   Desaturated <= 3% points yes  Got Tachycardic >/= 90/min yes   Symptoms at end of test asymptmatic   Miscellaneous comments Tendnce to desaturate+    CT Chest data from date: Feb 26, 2024  - personally visualized and independently interpreted : yes - some days earlier .  - my findings are: as below Narrative & Impression  CLINICAL DATA:  Interstitial lung abnormality, mold exposure, abnormal chest radiograph.   EXAM: CT CHEST WITHOUT CONTRAST   TECHNIQUE: Multidetector CT imaging of the chest was performed following the standard protocol without intravenous contrast. High resolution imaging of the lungs, as well as inspiratory and expiratory imaging, was performed.   RADIATION DOSE REDUCTION: This exam was performed according to the departmental dose-optimization program which includes automated exposure control, adjustment of the mA and/or kV according to patient size and/or use of iterative reconstruction technique.   COMPARISON:  Chest radiograph 01/04/2024.   FINDINGS: Cardiovascular: Atherosclerotic calcification of the aorta and aortic valve. Heart is enlarged. No pericardial effusion.   Mediastinum/Nodes: Mediastinal lymph nodes measure up  to 11 mm in the low right paratracheal station. Hilar regions are difficult to definitively evaluate without IV contrast. No axillary adenopathy. Esophagus is grossly unremarkable.   Lungs/Pleura: Image quality is degraded by respiratory motion. Peripheral and basilar predominant subpleural reticulation, traction bronchiectasis/bronchiolectasis and coarsened ground-glass. No definitive honeycombing. Findings occupy greater than 5% of the lung parenchyma. There may be minimal pleural thickening in the posteromedial left hemithorax. No pleural fluid. Airway is unremarkable. Expiratory phase imaging was not performed in true expiration, limiting the evaluation for air trapping.   Upper Abdomen: Visualized portions of the liver, adrenal glands, spleen stomach and bowel are grossly unremarkable.   Musculoskeletal: Osteopenia. Degenerative changes in the spine. L1 compression fracture is likely old.   IMPRESSION: 1. Pulmonary parenchymal pattern of interstitial lung disease, as detailed above, likely usual interstitial pneumonitis. Findings are categorized as probable UIP per consensus guidelines: Diagnosis of Idiopathic Pulmonary Fibrosis: An Official ATS/ERS/JRS/ALAT Clinical Practice Guideline. Am JINNY [employer] Crit Care Med Vol 198, Iss 5, [phone], Oct 15 2016. 2. Borderline enlarged mediastinal lymph nodes, likely reactive in the setting of interstitial lung disease. 3.  Aortic atherosclerosis (ICD10-I70.0).     Electronically Signed   By: Newell Eke M.D.   On: 02/28/2024 09:29      PFT      No data to display             LAB RESULTS last 96 hours No results found.       has a past medical history of Aortic stenosis and Murmur.   reports that she has never smoked. She has been exposed to tobacco smoke. She has never used smokeless tobacco.  History reviewed. No pertinent surgical history.  Allergies[1]  Immunization History  Administered Date(s)  Administered   PFIZER(Purple Top)SARS-COV-2 Vaccination 02/21/2019, 03/14/2019   Pfizer Covid-19 Vaccine Bivalent Booster 49yrs & up 12/16/2020   Pfizer(Comirnaty)Fall Seasonal Vaccine 12 years and older 12/07/2021   Tdap 12/01/2023    History reviewed. No pertinent family history.  Current Medications[2]      Objective:   Vitals:   03/12/24 1548  BP: 112/78  Pulse: 76  Temp: 97.8 F (36.6 C)  TempSrc: Oral  SpO2: 98%  Weight: 84 lb 12.8 oz (38.5 kg)  Height: 4' 9 (1.448 m)    Estimated body mass index is 18.35 kg/m as calculated from the following:   Height as of this encounter: 4' 9 (1.448 m).   Weight as of  this encounter: 84 lb 12.8 oz (38.5 kg).  @WEIGHTCHANGE @  [employer]   03/12/24 1548  Weight: 84 lb 12.8 oz (38.5 kg)     Physical Exam   General: No distress. Looks well O2 at rest: no Cane present: no Sitting in wheel chair: no Frail: mild Obese: no Neuro: Alert and Oriented x 3. GCS 15. Speech normal Psych: Pleasant         Assessment/     Assessment & Plan IPF (idiopathic pulmonary fibrosis) (HCC)  Encounter for therapeutic drug monitoring   Given the diagnosis of IPF based on probable UIP description and progression over some years.  It is still extremely mild.  She is asymptomatic.  Discussed low-dose Nerandomilast as an antifibrotic.  Explained the concepts of prevention.  Explained the side effect profile.  She did have slightly high AST in October 2025 therefore we will recheck it.  Overall she favors the benefit of the drug versus the risk profile but opts for monitoring. PLAN Patient Instructions  ILD/IPF  - There is very mild Pulmonary FIbrosis. Most likely a variety called IPF - IPF is new dx 03/12/2024   Plan -get rid of mold in the house  -check blood work for liver - check blood work to rule out rheumatoid arthritis that could be related to this - start low dose JASCAYD  - any diarrhea or discomfort we will stop  it - consider registry study at next visit  At risk for respiratory illnesses   Plan  - get rid of mold in house  - get rid of all down sofa, comforters, blankets  -Avoid respiratory illness sick exposure and control your risk for respiratory infection  - be uptodate with all respiratory vaccines  - avoid sick contacts especially in areas of indoor clusters (churches, weddings, funerals, family gatherings, birthdays, planes, malls, indoor areas especially)   - mask in these areas   - discourage sick people from coming in close contact with you   Followup  -return in 12 weeks; 15 min visit    FOLLOWUP    Return in about 3 months (around 06/10/2024) for 15 min visit, with Dr Geronimo, Face to Face Visit.    SIGNATURE    Dr. Dorethia Geronimo, M.D., F.C.C.P,  Pulmonary and Critical Care Medicine Staff Physician, [facility] Director - Interstitial Lung Disease  Program  Pulmonary Fibrosis [facility] [place], [place], [address]  Pager: [phone], If no answer or between  15:00h - 7:00h: call [phone] Telephone: [phone]  4:30 PM 03/12/2024    [1] No Known Allergies [2]  Current Outpatient Medications:    azelastine  (ASTELIN ) 0.1 % nasal spray, Place 2 sprays into both nostrils 2 (two) times daily. Use in each nostril as directed, Disp: 30 mL, Rfl: 12   Calcium Carbonate (CALTRATE 600 PO), Take 1 tablet by mouth daily., Disp: , Rfl:    Calcium Carbonate-Vitamin D 600-400 MG-UNIT tablet, Take by mouth., Disp: , Rfl:    cyanocobalamin (VITAMIN B12) 1000 MCG tablet, Take 1,000 mcg by mouth daily., Disp: , Rfl:    ELDERBERRY PO, Take by mouth daily., Disp: , Rfl:    Multiple Vitamins-Minerals (CENTRUM SILVER PO), Take by mouth., Disp: , Rfl:    VITAMIN E PO, Take 1 capsule by mouth daily., Disp: , Rfl:    aspirin 81 MG chewable tablet, Chew by mouth daily. (Patient not taking: Reported on 03/12/2024), Disp: ,  Rfl:    MYRBETRIQ 25 MG TB24 tablet, Take 25 mg by mouth daily. (Patient not taking: Reported on 03/12/2024), Disp: , Rfl:   "

## 2024-03-12 NOTE — Patient Instructions (Addendum)
 ILD/IPF  - There is very mild Pulmonary FIbrosis. Most likely a variety called IPF - IPF is new dx 03/12/2024   Plan -get rid of mold in the house  -check blood work for liver - check blood work to rule out rheumatoid arthritis that could be related to this - start low dose JASCAYD  - any diarrhea or discomfort we will stop it - consider registry study at next visit  At risk for respiratory illnesses   Plan  - get rid of mold in house  - get rid of all down sofa, comforters, blankets  -Avoid respiratory illness sick exposure and control your risk for respiratory infection  - be uptodate with all respiratory vaccines  - avoid sick contacts especially in areas of indoor clusters (churches, weddings, funerals, family gatherings, birthdays, planes, malls, indoor areas especially)   - mask in these areas   - discourage sick people from coming in close contact with you   Followup  -return in 12 weeks; 15 min visit

## 2024-03-13 LAB — HEPATIC FUNCTION PANEL
ALT: 11 U/L (ref 3–35)
AST: 23 U/L (ref 5–37)
Albumin: 4 g/dL (ref 3.5–5.2)
Alkaline Phosphatase: 71 U/L (ref 39–117)
Bilirubin, Direct: 0 mg/dL — ABNORMAL LOW (ref 0.1–0.3)
Total Bilirubin: 0.3 mg/dL (ref 0.2–1.2)
Total Protein: 7.8 g/dL (ref 6.0–8.3)

## 2024-03-13 NOTE — Telephone Encounter (Signed)
 Patient was seen in the office yesterday and she did have her lab work done.  Nothing further needed.

## 2024-03-14 ENCOUNTER — Telehealth: Payer: Self-pay

## 2024-03-14 LAB — ANTI-NUCLEAR AB-TITER (ANA TITER)
ANA TITER: 1:80 {titer} — ABNORMAL HIGH
ANA Titer 1: 1:40 {titer} — ABNORMAL HIGH

## 2024-03-14 LAB — ANA: Anti Nuclear Antibody (ANA): POSITIVE — AB

## 2024-03-14 LAB — CYCLIC CITRUL PEPTIDE ANTIBODY, IGG: Cyclic Citrullin Peptide Ab: <16 U

## 2024-03-14 LAB — RHEUMATOID FACTOR: Rheumatoid fact SerPl-aCnc: 10 [IU]/mL

## 2024-03-14 NOTE — Telephone Encounter (Signed)
 Copied from CRM #[id number]. Topic: Appointments - Scheduling Inquiry for Clinic >> Mar 11, 2024  4:19 PM Rozanna MATSU wrote: Reason for CRM: pt daughter calling about her labs stated she wanted to bring her in the morning if the provider wanted her to fast with labs. Advised her that no orders for labs but if he wants her to have labs they can discuss tomorrow at her appt.  Pt seen by Dr. Geronimo on 03/12/24. NFN

## 2024-03-15 ENCOUNTER — Ambulatory Visit: Payer: Self-pay | Admitting: Internal Medicine

## 2024-03-15 NOTE — Progress Notes (Signed)
 Low grade ANA positive at age >31 is normal. LFT normal. Ok to start jascayd

## 2024-03-22 ENCOUNTER — Telehealth: Payer: Self-pay

## 2024-03-22 NOTE — Telephone Encounter (Signed)
 Submitted a Prior Authorization request to [facility] [employer]IC[employer] for JASCAYD via CoverMyMeds. Will update once we receive a response.  Key: [id number]

## 2024-03-22 NOTE — Telephone Encounter (Signed)
 Received referral for new start Jascayd low dose. Opening benefits investigation in this thread.

## 2024-06-18 ENCOUNTER — Ambulatory Visit: Admitting: Internal Medicine

## 2024-06-20 ENCOUNTER — Ambulatory Visit (HOSPITAL_COMMUNITY)
